# Patient Record
Sex: Female | Born: 1985 | Race: Black or African American | Hispanic: No | Marital: Single | Smoking: Never smoker
Health system: Southern US, Community
[De-identification: ages and names within clinical notes are randomized; demographics above are authoritative.]

## PROBLEM LIST (undated history)

## (undated) DIAGNOSIS — B3324 Viral cardiomyopathy: Secondary | ICD-10-CM

## (undated) DIAGNOSIS — R51 Headache: Secondary | ICD-10-CM

## (undated) DIAGNOSIS — F329 Major depressive disorder, single episode, unspecified: Secondary | ICD-10-CM

## (undated) DIAGNOSIS — F32A Depression, unspecified: Secondary | ICD-10-CM

## (undated) DIAGNOSIS — N3010 Interstitial cystitis (chronic) without hematuria: Secondary | ICD-10-CM

## (undated) DIAGNOSIS — R519 Headache, unspecified: Secondary | ICD-10-CM

## (undated) DIAGNOSIS — D6800 Von Willebrand disease, unspecified: Secondary | ICD-10-CM

## (undated) DIAGNOSIS — R55 Syncope and collapse: Secondary | ICD-10-CM

## (undated) HISTORY — DX: Major depressive disorder, single episode, unspecified: F32.9

## (undated) HISTORY — DX: Headache: R51

## (undated) HISTORY — PX: OTHER SURGICAL HISTORY: SHX169

## (undated) HISTORY — DX: Syncope and collapse: R55

## (undated) HISTORY — DX: Interstitial cystitis (chronic) without hematuria: N30.10

## (undated) HISTORY — DX: Viral cardiomyopathy: B33.24

## (undated) HISTORY — DX: Headache, unspecified: R51.9

## (undated) HISTORY — DX: Depression, unspecified: F32.A

---
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
## 2007-02-17 ENCOUNTER — Ambulatory Visit: Payer: Self-pay | Admitting: Internal Medicine

## 2007-03-09 ENCOUNTER — Ambulatory Visit: Payer: Self-pay | Admitting: Obstetrics & Gynecology

## 2007-03-14 ENCOUNTER — Ambulatory Visit: Payer: Self-pay | Admitting: Internal Medicine

## 2007-03-16 IMAGING — CR RIGHT HAND - COMPLETE 3+ VIEW
1 series · 3 of 3 positions shown · non-contrast
Comparison: none

REASON FOR EXAM: pain
COMMENTS:

PROCEDURE:     DXR - DXR HAND RT COMPLETE W/OBLIQUES  - December 11, 2005  [DATE]
RESULT:     There does not appear to be evidence of fracture, dislocation,
or malalignment. No evidence of soft tissue swelling is appreciated.

[Series 1: view not recorded · 0.17mm/px · 3 of 3 slices shown]
[im 1/3]
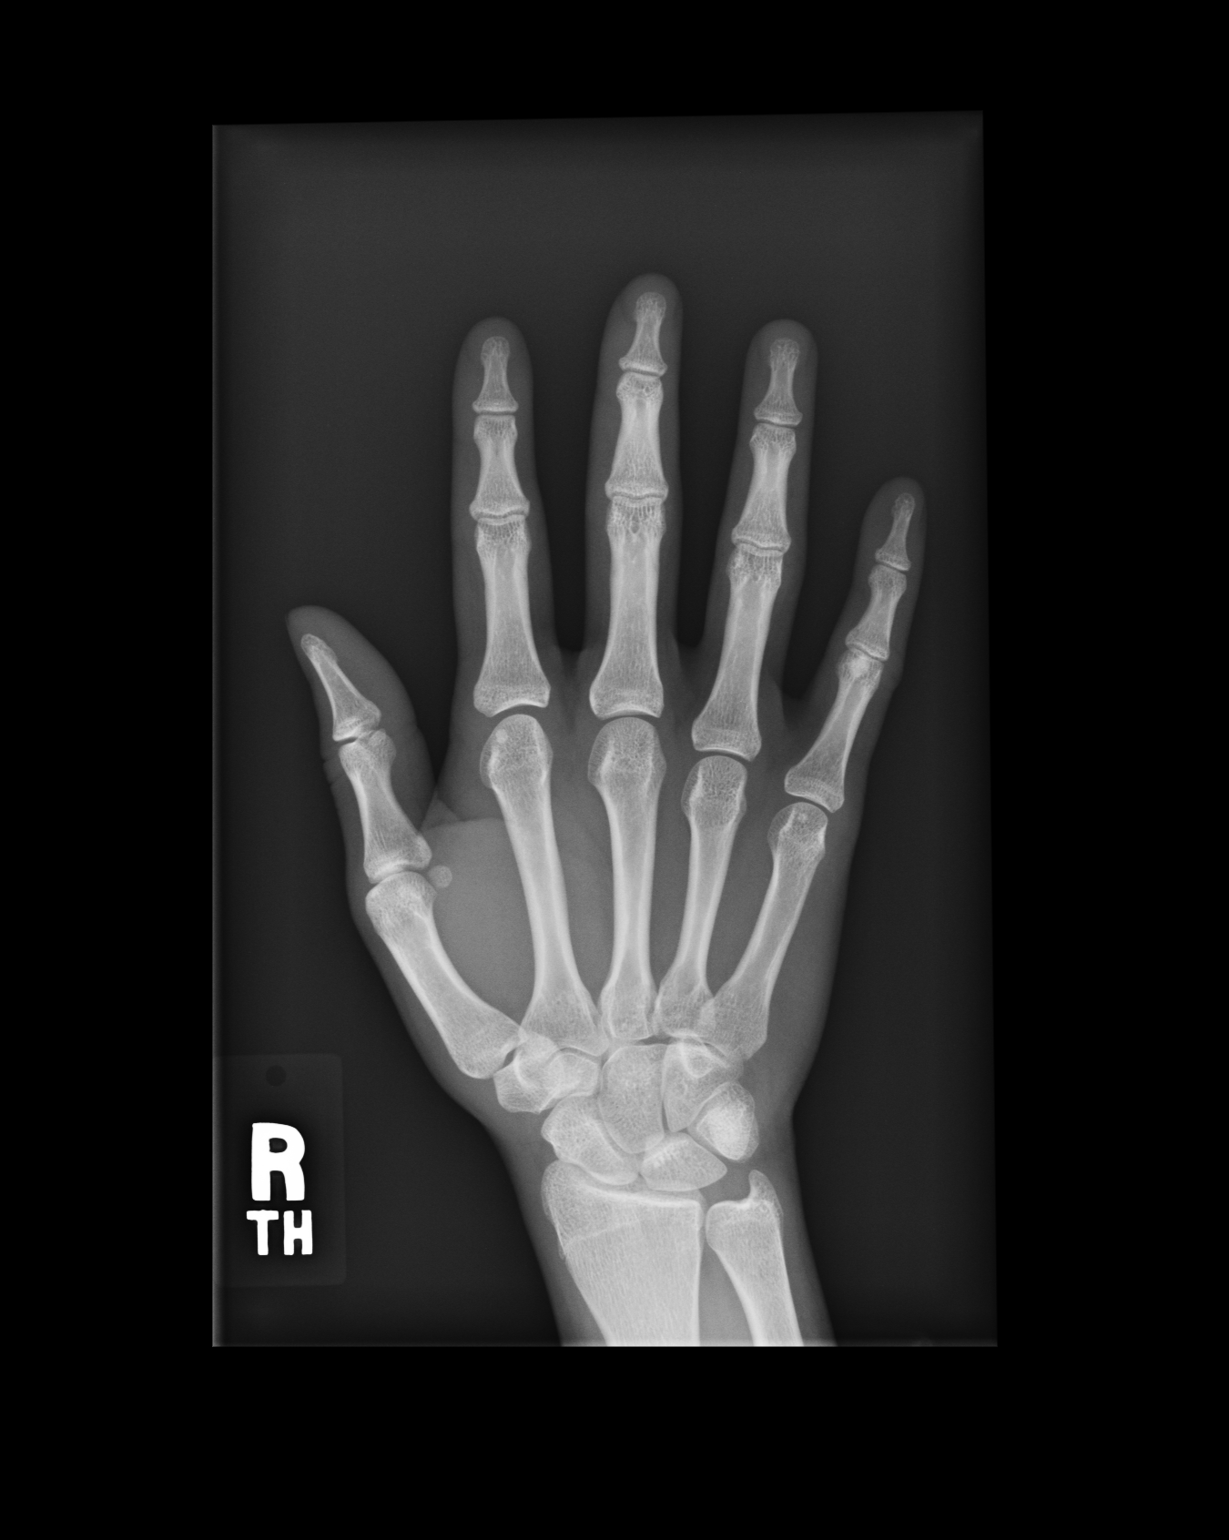
[im 2/3]
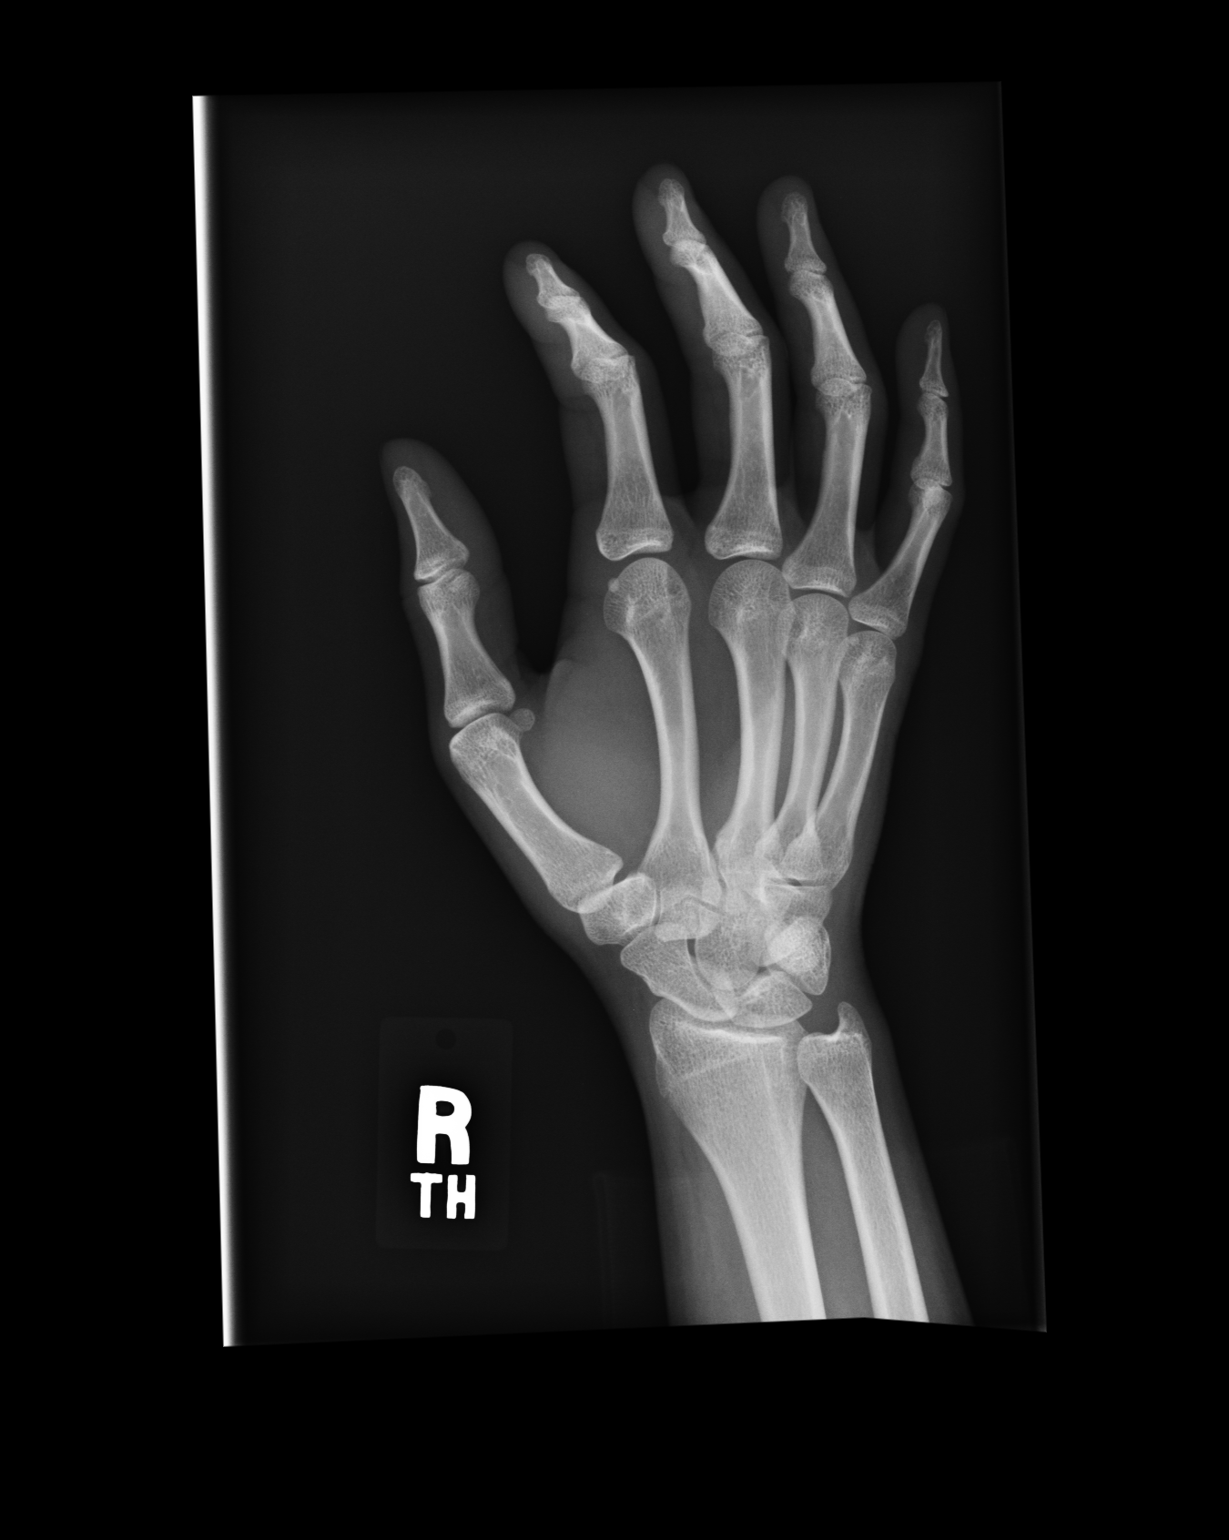
[im 3/3]
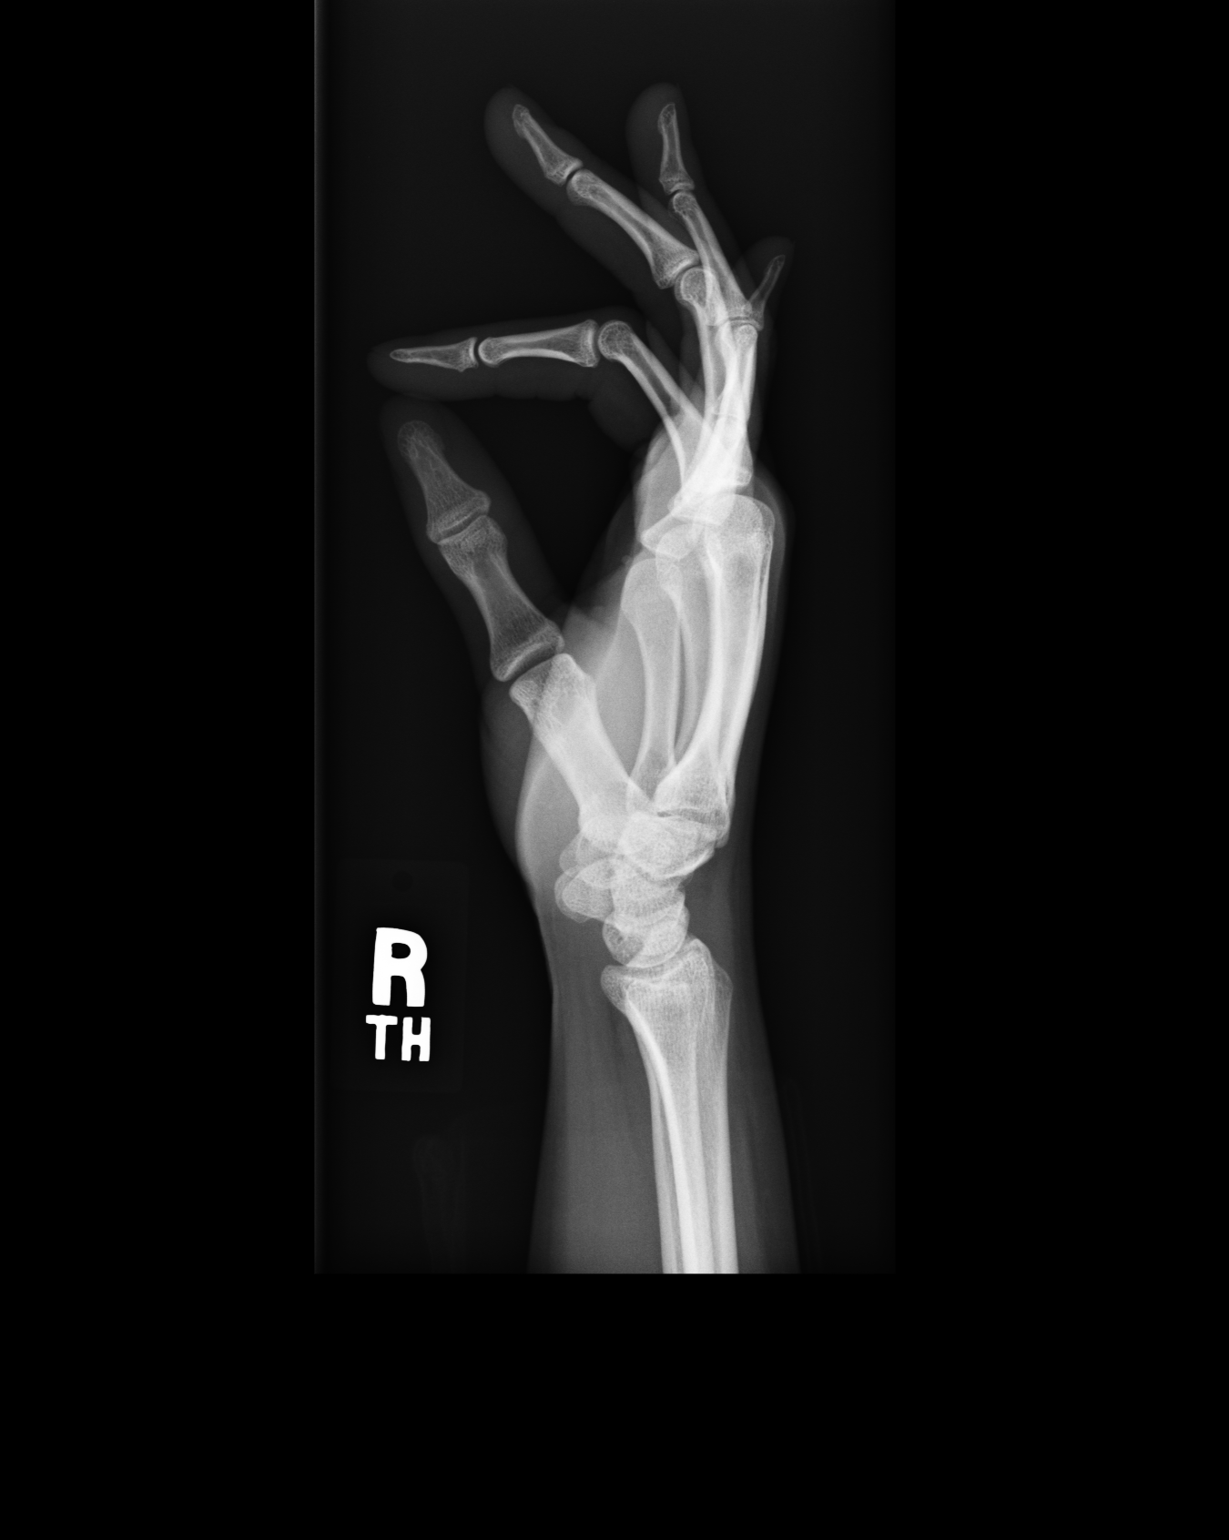

[3 of 3 positions shown; findings below may reference images not displayed]

IMPRESSION: 1)No evidence of acute abnormality. If there are persistent complaints of
pain or persistent clinical concern, repeat evaluation in 7-10 days is
recommended if clinically warranted.

## 2007-03-20 ENCOUNTER — Ambulatory Visit: Payer: Self-pay | Admitting: Oncology

## 2007-03-20 ENCOUNTER — Ambulatory Visit: Payer: Self-pay | Admitting: Internal Medicine

## 2007-04-06 ENCOUNTER — Ambulatory Visit: Payer: Self-pay | Admitting: Obstetrics & Gynecology

## 2007-04-14 ENCOUNTER — Inpatient Hospital Stay: Payer: Self-pay | Admitting: Obstetrics & Gynecology

## 2007-04-19 ENCOUNTER — Ambulatory Visit: Payer: Self-pay | Admitting: Internal Medicine

## 2007-04-19 ENCOUNTER — Ambulatory Visit: Payer: Self-pay | Admitting: Oncology

## 2007-05-20 ENCOUNTER — Ambulatory Visit: Payer: Self-pay | Admitting: Internal Medicine

## 2007-06-07 ENCOUNTER — Ambulatory Visit: Payer: Self-pay | Admitting: Urology

## 2007-07-19 ENCOUNTER — Ambulatory Visit: Payer: Self-pay | Admitting: Urology

## 2011-10-20 DIAGNOSIS — B3324 Viral cardiomyopathy: Secondary | ICD-10-CM

## 2011-10-20 HISTORY — DX: Viral cardiomyopathy: B33.24

## 2012-09-17 ENCOUNTER — Observation Stay: Payer: Self-pay | Admitting: Internal Medicine

## 2012-09-17 LAB — CK TOTAL AND CKMB (NOT AT ARMC): CK-MB: <0.5 ng/mL — ABNORMAL LOW (ref 0.5–3.6)

## 2012-09-17 LAB — COMPREHENSIVE METABOLIC PANEL
Albumin: 4 g/dL (ref 3.4–5.0)
Alkaline Phosphatase: 82 U/L (ref 50–136)
Bilirubin,Total: 0.8 mg/dL (ref 0.2–1.0)
Creatinine: 0.83 mg/dL (ref 0.60–1.30)
Glucose: 91 mg/dL (ref 65–99)
Osmolality: 277 (ref 275–301)
Potassium: 3.7 mmol/L (ref 3.5–5.1)
Sodium: 139 mmol/L (ref 136–145)
Total Protein: 7.8 g/dL (ref 6.4–8.2)

## 2012-09-17 LAB — CBC
HCT: 37.6 % (ref 35.0–47.0)
MCHC: 34.1 g/dL (ref 32.0–36.0)
MCV: 85 fL (ref 80–100)
RDW: 13 % (ref 11.5–14.5)

## 2012-09-17 LAB — URINALYSIS, COMPLETE
Bacteria: NONE SEEN
Bilirubin,UR: NEGATIVE
Blood: NEGATIVE
Glucose,UR: NEGATIVE mg/dL (ref 0–75)
Leukocyte Esterase: NEGATIVE
Ph: 6 (ref 4.5–8.0)
RBC,UR: 1 /HPF (ref 0–5)
Squamous Epithelial: 1
WBC UR: <1 /HPF (ref 0–5)

## 2012-09-19 DIAGNOSIS — R55 Syncope and collapse: Secondary | ICD-10-CM

## 2013-07-04 ENCOUNTER — Ambulatory Visit: Payer: Self-pay

## 2013-07-04 LAB — COMPREHENSIVE METABOLIC PANEL
Alkaline Phosphatase: 80 U/L (ref 50–136)
Bilirubin,Total: 0.8 mg/dL (ref 0.2–1.0)
Chloride: 104 mmol/L (ref 98–107)
Co2: 25 mmol/L (ref 21–32)
Creatinine: 0.82 mg/dL (ref 0.60–1.30)
EGFR (African American): >60
EGFR (Non-African Amer.): >60
Osmolality: 279 (ref 275–301)
Potassium: 3.5 mmol/L (ref 3.5–5.1)
SGPT (ALT): 44 U/L (ref 12–78)
Sodium: 141 mmol/L (ref 136–145)

## 2013-07-04 LAB — CBC WITH DIFFERENTIAL/PLATELET
Basophil #: 0.1 10*3/uL (ref 0.0–0.1)
Basophil %: 0.8 %
Eosinophil %: 0.4 %
Lymphocyte %: 15 %
MCV: 85 fL (ref 80–100)
Monocyte #: 0.7 x10 3/mm (ref 0.2–0.9)
Neutrophil #: 5.2 10*3/uL (ref 1.4–6.5)
Neutrophil %: 74 %
Platelet: 335 10*3/uL (ref 150–440)
RBC: 4.2 10*6/uL (ref 3.80–5.20)

## 2013-07-04 LAB — URINALYSIS, COMPLETE
Bilirubin,UR: NEGATIVE
Ph: 6 (ref 4.5–8.0)

## 2013-07-04 LAB — RAPID INFLUENZA A&B ANTIGENS

## 2013-07-19 ENCOUNTER — Encounter: Payer: Self-pay | Admitting: Adult Health

## 2013-07-19 ENCOUNTER — Ambulatory Visit (INDEPENDENT_AMBULATORY_CARE_PROVIDER_SITE_OTHER): Payer: BC Managed Care – PPO | Admitting: Cardiovascular Disease

## 2013-07-19 ENCOUNTER — Ambulatory Visit (INDEPENDENT_AMBULATORY_CARE_PROVIDER_SITE_OTHER): Payer: BC Managed Care – PPO | Admitting: Adult Health

## 2013-07-19 ENCOUNTER — Encounter: Payer: Self-pay | Admitting: Cardiovascular Disease

## 2013-07-19 VITALS — BP 108/72 | HR 82 | Temp 98.6°F | Resp 12 | Ht 63.0 in | Wt 128.5 lb

## 2013-07-19 VITALS — BP 122/83 | HR 90 | Ht 63.0 in | Wt 128.5 lb

## 2013-07-19 DIAGNOSIS — I428 Other cardiomyopathies: Secondary | ICD-10-CM

## 2013-07-19 DIAGNOSIS — R079 Chest pain, unspecified: Secondary | ICD-10-CM

## 2013-07-19 DIAGNOSIS — B3324 Viral cardiomyopathy: Secondary | ICD-10-CM

## 2013-07-19 DIAGNOSIS — R42 Dizziness and giddiness: Secondary | ICD-10-CM

## 2013-07-19 DIAGNOSIS — R5383 Other fatigue: Secondary | ICD-10-CM

## 2013-07-19 DIAGNOSIS — D680 Von Willebrand's disease: Secondary | ICD-10-CM

## 2013-07-19 DIAGNOSIS — R000 Tachycardia, unspecified: Secondary | ICD-10-CM

## 2013-07-19 DIAGNOSIS — R0789 Other chest pain: Secondary | ICD-10-CM

## 2013-07-19 DIAGNOSIS — Z0000 Encounter for general adult medical examination without abnormal findings: Secondary | ICD-10-CM | POA: Insufficient documentation

## 2013-07-19 DIAGNOSIS — R5381 Other malaise: Secondary | ICD-10-CM

## 2013-07-19 LAB — CBC WITH DIFFERENTIAL/PLATELET
Basophils Relative: 0.6 % (ref 0.0–3.0)
Eosinophils Absolute: 0.1 10*3/uL (ref 0.0–0.7)
Eosinophils Relative: 1.5 % (ref 0.0–5.0)
Hemoglobin: 12.6 g/dL (ref 12.0–15.0)
Lymphocytes Relative: 33.8 % (ref 12.0–46.0)
Lymphs Abs: 1.6 10*3/uL (ref 0.7–4.0)
Monocytes Absolute: 0.3 10*3/uL (ref 0.1–1.0)
Neutrophils Relative %: 58.1 % (ref 43.0–77.0)
Platelets: 474 10*3/uL — ABNORMAL HIGH (ref 150.0–400.0)
RBC: 4.4 Mil/uL (ref 3.87–5.11)

## 2013-07-19 NOTE — Addendum Note (Signed)
Addended by: Rhea Belton R on: 07/19/2013 11:34 AM   Modules accepted: Orders

## 2013-07-19 NOTE — Assessment & Plan Note (Signed)
Patient with von Willebrand's disease diagnosed in 2008. She has not followed up with hematology. She is currently exhibiting metromenorrhagia x10 months. Refer to [facility] hematology.

## 2013-07-19 NOTE — Assessment & Plan Note (Signed)
Charlotte Martin presents today for further evaluation of her shortness of breath with exertion. 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 Her ejection fraction was reportedly around 40%. She was treated with Coreg valsartan with improvement of her left ventricular function. Unfortunately, her blood pressure fell and she was not able to tolerate long-term. She's done fairly well until recently when she started having episodes of lightheadedness as well as recurrent episodes of shortness breath. She states that her main complaint is that of dizziness and fatigue.  Is possible that she has developed recurrent systolic dysfunction. We'll get an echocardiogram for further evaluation of her LV function. I'll see her back in the office in 2-3 months for followup visit.

## 2013-07-19 NOTE — Assessment & Plan Note (Signed)
She'll be seeing a hematologist at [facility].

## 2013-07-19 NOTE — Assessment & Plan Note (Addendum)
Patient with diagnosis of viral cardiomyopathy in 2013 while studying in [place]. She reports being hospitalized for approximately one week. I will try to obtain those medical records. She is now exhibiting many of the symptoms that she was having at that time including excessive sweating, fatigue, fevers, malaise, near-syncopal events. I am referring her to cardiology for further evaluation. The patient had been told that her ejection fraction was 40%. She reports inability to follow up with care at the time secondary to not having insurance. She has an appointment today with Dr. Elease Hashimoto.

## 2013-07-19 NOTE — Assessment & Plan Note (Signed)
She complains of having some dizziness,.  This seems to be one of her largest complaints. I don't think that this has any cardiac etiology. She is not orthostatic. I have recommended that she try eating several   small meals through the day.

## 2013-07-19 NOTE — Patient Instructions (Addendum)
Your physician has requested that you have an echocardiogram.  Echocardiography is a painless test that uses sound waves to create images of your heart.  It provides your doctor with information about the size and shape of your heart and how well your heart's chambers and valves are working.  This procedure takes approximately one hour.  There are no restrictions for this procedure.  Your physician wants you to follow-up in: 2-3 months.

## 2013-07-19 NOTE — Progress Notes (Signed)
Subjective:    Patient ID: Charlotte Martin, female    DOB: 1986/09/27, 27 y.o.   MRN: [id number]  HPI  Charlotte Martin is a pleasant 27 y/o female who presents to clinic to establish care. She has not had a primary care physician in years. Recently living in [place] attending [employer] for her PhD.  She reports that in 2013 she was diagnosed with viral cardiomyopathy and was hospitalized for approximately one week. She reports losing her insurance and unable to follow up with her care. She is currently exhibiting many of the symptoms that she was exhibiting at that time including weakness and fatigue, fevers "off and on", excessive sweating, malaise and syncopal type episodes. She reports that she often feels like she has the flu. The symptoms will subside and then return in 2-3 weeks. She reports that, while she was hospitalized, she had an echo which showed an ejection fraction of 40%.  Patient also report metromenorrhagia. Her menstrual cycles are extremely heavy with a large amount of clots noted. She experiences bad cramping, diarrhea during her cycle. She reports that her cycle is approximately every 2 weeks and this has been ongoing for approximately 10 months. She has a history of von Willebrand but did not follow up with care.     Past Medical History  Diagnosis Date  . Depression   . Von Willebrand's disease   . Frequent headaches   . Viral cardiomyopathy 2013  . Interstitial cystitis   . Syncope and collapse      Past Surgical History  Procedure Laterality Date  . Cystoscopy  2008  . Laparoscopy  2008  . Interstem implant       Family History  Problem Relation Age of Onset  . Arthritis Mother   . Hyperlipidemia Mother   . Hypertension Mother   . Depression Mother   . Diabetes Mother   . Seizures Sister   . Cancer Maternal Aunt     Breast cancer  . Cancer Paternal Aunt     ovarian cancer  . Cancer Maternal Grandmother     breast cancer  .  Diabetes Maternal Grandmother   . Cancer Maternal Grandfather     colon cancer  . Diabetes Maternal Grandfather   . Cancer Paternal Grandmother     ovarian cancer  . Diabetes Paternal Grandmother   . Cancer Paternal Grandfather     prostate cancer  . Diabetes Paternal Grandfather   . Stroke Sister      History   Social History  . Marital Status: Single    Spouse Name: N/A    Number of Children: N/A  . Years of Education: N/A   Occupational History  . Not on file.   Social History Main Topics  . Smoking status: Never Smoker   . Smokeless tobacco: Never Used  . Alcohol Use: No  . Drug Use: No  . Sexual Activity: Not on file   Other Topics Concern  . Not on file   Social History Narrative  . No narrative on file     Review of Systems  Constitutional: Positive for fatigue.  HENT: Negative.   Eyes:       Hx of trouble with peripheral vision. She reports having this examined with nothing identified.  Respiratory: Positive for chest tightness and shortness of breath.        Occasionally has to sit up in bed to breathe better.   Cardiovascular: Negative for chest pain and leg swelling.  Gastrointestinal: Positive for nausea and diarrhea.       Reports nausea and diarrhea associated with menstrual cycle.  Endocrine: Negative.   Genitourinary: Positive for frequency and menstrual problem. Negative for dysuria and hematuria.       Hx of interstitial cystitis. Followed by Dr. Achilles Dunk  Allergic/Immunologic: Negative.   Neurological: Positive for syncope, weakness and light-headedness.  Hematological:       Hx of von Willebrand   Psychiatric/Behavioral: Positive for sleep disturbance.       Hx of situational depression. No symptoms at this time.       Objective:   Physical Exam  Constitutional: She is oriented to person, place, and time. She appears well-developed and well-nourished.  HENT:  Head: Normocephalic and atraumatic.  Right Ear: External ear normal.  Left  Ear: External ear normal.  Nose: Nose normal.  Mouth/Throat: Oropharynx is clear and moist.  Eyes: Conjunctivae and EOM are normal. Pupils are equal, round, and reactive to light.  Neck: Normal range of motion. Neck supple. No tracheal deviation present. No thyromegaly present.  Cardiovascular: Normal rate, regular rhythm, normal heart sounds and intact distal pulses.  Exam reveals no gallop and no friction rub.   No murmur heard. Pulmonary/Chest: Effort normal and breath sounds normal. No respiratory distress. She has no wheezes. She has no rales.  Abdominal: Soft. Bowel sounds are normal. She exhibits no distension and no mass. There is no tenderness. There is no rebound and no guarding.  Musculoskeletal: Normal range of motion. She exhibits no edema and no tenderness.  Lymphadenopathy:    She has no cervical adenopathy.  Neurological: She is alert and oriented to person, place, and time. She has normal reflexes. No cranial nerve deficit. Coordination normal.  Skin: Skin is warm. No rash noted. She is diaphoretic. No erythema.  Psychiatric: She has a normal mood and affect. Her behavior is normal. Judgment and thought content normal.    BP 108/72  Pulse 82  Temp(Src) 98.6 F (37 C) (Oral)  Resp 12  Ht 5\' 3"  (1.6 m)  Wt 128 lb 8 oz (58.287 kg)  BMI 22.77 kg/m2  SpO2 98%  LMP 06/13/2013       Assessment & Plan:

## 2013-07-19 NOTE — Progress Notes (Signed)
Charlotte Martin Date of Birth  [birth year]       [facility]    [facility] [address]  [address] [place], [place]  [address]   [place], [place]  [address] [phone]     [phone]   Fax  [phone]    Fax [phone]  Problem List: 1. ? Viral cardiomyopathy 2. PAF - transient AF in the setting of syncope 3. von Willebrand syndrome - 2008 - she avoids ASA for this reason. 4. Interstitial cycstitis 5.   History of Present Illness:  Charlotte Martin is a 27 yo female with a past hx of viral CM in July 2013.   [place] time - echo is not availale - she recalls being told that her EF was 40%.     She was treated with Coreg and Losartan and improved.  The Coreg and Cozaar were discontinued after her LV function improved and because of episodes of hypotension.  Recently, she has had some recurrence of her dyspnea - DOE.    She has numerous complaints - dizziness,   She used to exercise regularly - not so much this past year - mostly due to busy schedule but also now she just is not able to run like she used to run.    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     Current Outpatient Prescriptions on File Prior to Visit  Medication Sig Dispense Refill  . imipramine (TOFRANIL) 25 MG tablet Take 25 mg by mouth at bedtime.       No current facility-administered medications on file prior to visit.    Allergies  Allergen Reactions  . Aspirin     Past Medical History  Diagnosis Date  . Depression   . Von Willebrand's disease   . Frequent headaches   . Viral cardiomyopathy 2013  . Interstitial cystitis   . Syncope and collapse     Past Surgical History  Procedure Laterality Date  . Cystoscopy  2008  . Laparoscopy  2008  . Interstem implant      History  Smoking status  . Never Smoker   Smokeless tobacco  . Never Used    History  Alcohol Use No    Family History  Problem  Relation Age of Onset  . Arthritis Mother   . Hyperlipidemia Mother   . Hypertension Mother   . Depression Mother   . Diabetes Mother   . Seizures Sister   . Cancer Maternal Aunt     Breast cancer  . Cancer Paternal Aunt     ovarian cancer  . Cancer Maternal Grandmother     breast cancer  . Diabetes Maternal Grandmother   . Cancer Maternal Grandfather     colon cancer  . Diabetes Maternal Grandfather   . Cancer Paternal Grandmother     ovarian cancer  . Diabetes Paternal Grandmother   . Cancer Paternal Grandfather     prostate cancer  . Diabetes Paternal Grandfather   . Stroke Sister     Reviw of Systems:  Reviewed in the HPI.  All other systems are negative.  Physical Exam: Blood pressure 122/83, pulse 90, height 5\' 3"  (1.6 m), weight 128 lb 8 oz (58.287 kg), last menstrual period 06/13/2013. General: Well developed, well nourished, in no acute distress.  Head: Normocephalic, atraumatic, sclera non-icteric, mucus membranes are moist,   Neck: Supple. Carotids are  2 + without bruits. No JVD   Lungs: Clear   Heart: RR, normal S1, S2  Abdomen: Soft, non-tender, non-distended with normal bowel sounds.  Msk:  Strength and tone are normal   Extremities: No clubbing or cyanosis. No edema.  Distal pedal pulses are 2+ and equal    Neuro: CN II - XII intact.  Alert and oriented X 3.   Psych:  Normal   ECG: Oct. 1, 2014:  NSR at 90.    Assessment / Plan:

## 2013-07-19 NOTE — Assessment & Plan Note (Signed)
Patient has not had a regular PCP in many years. Her physical exam shows significant diaphoresis and getting slightly off balance with changing position. Otherwise exam is normal. I will check routine labs including CBC with differential, basic metabolic panel, hepatic panel, TSH, vitamin D, lipids. I am referring her to hematology for further evaluation of von Willebrand's disorder. I am referring her to cardiology for further evaluation of viral cardiomyopathy with patient reports of ejection fraction 40%.

## 2013-07-20 ENCOUNTER — Other Ambulatory Visit: Payer: Self-pay | Admitting: Adult Health

## 2013-07-20 LAB — LIPID PANEL
Total CHOL/HDL Ratio: 3
VLDL: 13.6 mg/dL (ref 0.0–40.0)

## 2013-07-20 LAB — HEPATIC FUNCTION PANEL
ALT: 16 U/L (ref 0–35)
AST: 18 U/L (ref 0–37)
Albumin: 4.3 g/dL (ref 3.5–5.2)
Alkaline Phosphatase: 64 U/L (ref 39–117)
Bilirubin, Direct: 0.1 mg/dL (ref 0.0–0.3)
Total Bilirubin: 0.9 mg/dL (ref 0.3–1.2)
Total Protein: 7.6 g/dL (ref 6.0–8.3)

## 2013-07-20 LAB — BASIC METABOLIC PANEL WITH GFR
BUN: 14 mg/dL (ref 6–23)
CO2: 26 meq/L (ref 19–32)
Calcium: 9.8 mg/dL (ref 8.4–10.5)
Chloride: 105 meq/L (ref 96–112)
Creatinine, Ser: 0.7 mg/dL (ref 0.4–1.2)
GFR: 137.84 mL/min
Glucose, Bld: 107 mg/dL — ABNORMAL HIGH (ref 70–99)
Potassium: 4 meq/L (ref 3.5–5.1)
Sodium: 139 meq/L (ref 135–145)

## 2013-07-20 LAB — VITAMIN D 25 HYDROXY (VIT D DEFICIENCY, FRACTURES): Vit D, 25-Hydroxy: 12 ng/mL — ABNORMAL LOW (ref 30–89)

## 2013-07-20 MED ORDER — ERGOCALCIFEROL 1.25 MG (50000 UT) PO CAPS: 50000.0000 [IU] | ORAL_CAPSULE | ORAL | Status: AC

## 2013-08-02 ENCOUNTER — Encounter: Payer: Self-pay | Admitting: Cardiovascular Disease

## 2013-08-11 ENCOUNTER — Other Ambulatory Visit: Payer: Self-pay

## 2013-08-11 ENCOUNTER — Other Ambulatory Visit (INDEPENDENT_AMBULATORY_CARE_PROVIDER_SITE_OTHER): Payer: BC Managed Care – PPO

## 2013-08-11 DIAGNOSIS — I429 Cardiomyopathy, unspecified: Secondary | ICD-10-CM

## 2013-08-11 DIAGNOSIS — B3324 Viral cardiomyopathy: Secondary | ICD-10-CM

## 2013-08-11 DIAGNOSIS — R0602 Shortness of breath: Secondary | ICD-10-CM

## 2013-08-11 DIAGNOSIS — R079 Chest pain, unspecified: Secondary | ICD-10-CM

## 2013-08-21 ENCOUNTER — Encounter: Payer: Self-pay | Admitting: *Deleted

## 2013-08-24 ENCOUNTER — Other Ambulatory Visit: Payer: Self-pay

## 2013-09-20 ENCOUNTER — Ambulatory Visit: Payer: BC Managed Care – PPO | Admitting: Cardiovascular Disease

## 2013-10-22 ENCOUNTER — Encounter: Payer: Self-pay | Admitting: Adult Health

## 2013-12-21 IMAGING — CR DG CHEST 2V
1 series · 2 of 2 positions shown · non-contrast
Comparison: none

REASON FOR EXAM: syncope
COMMENTS:

[Series 1: w chest pa · 0.14mm/px · 2 of 2 slices shown]
[im 1/2]
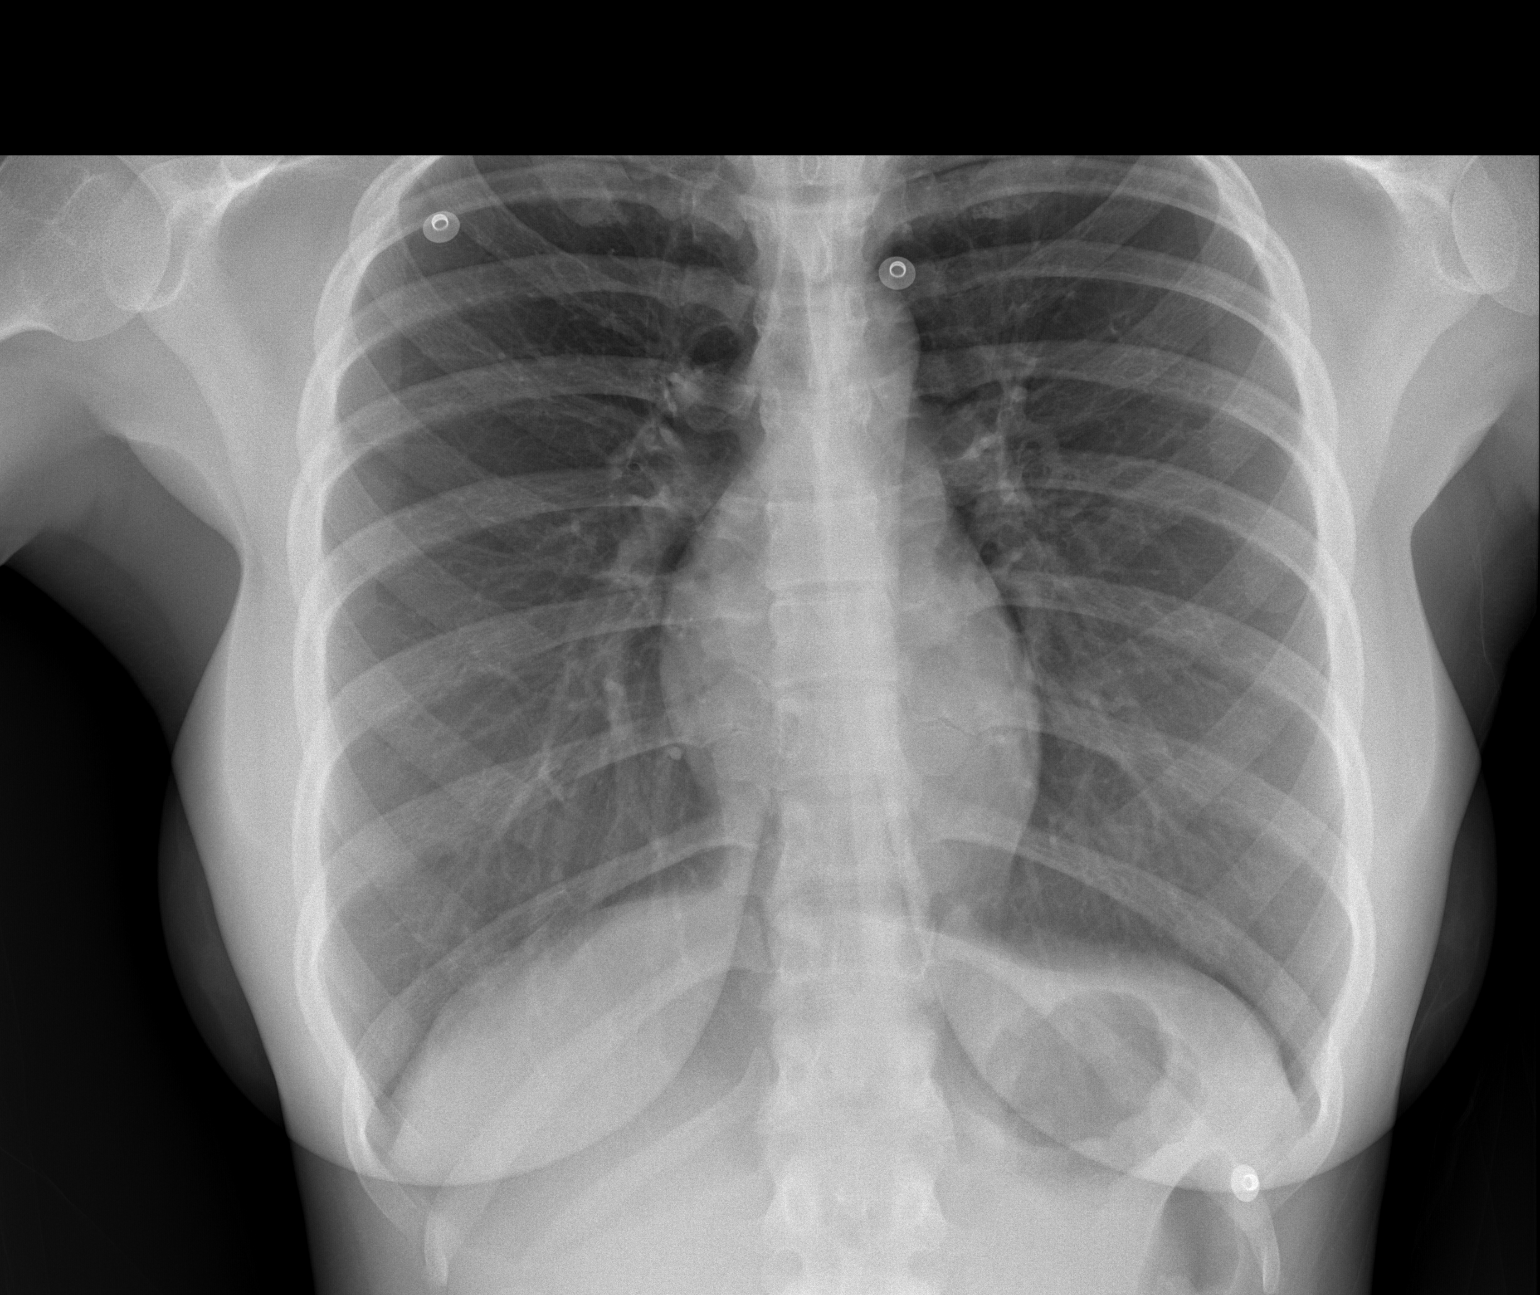
[im 2/2]
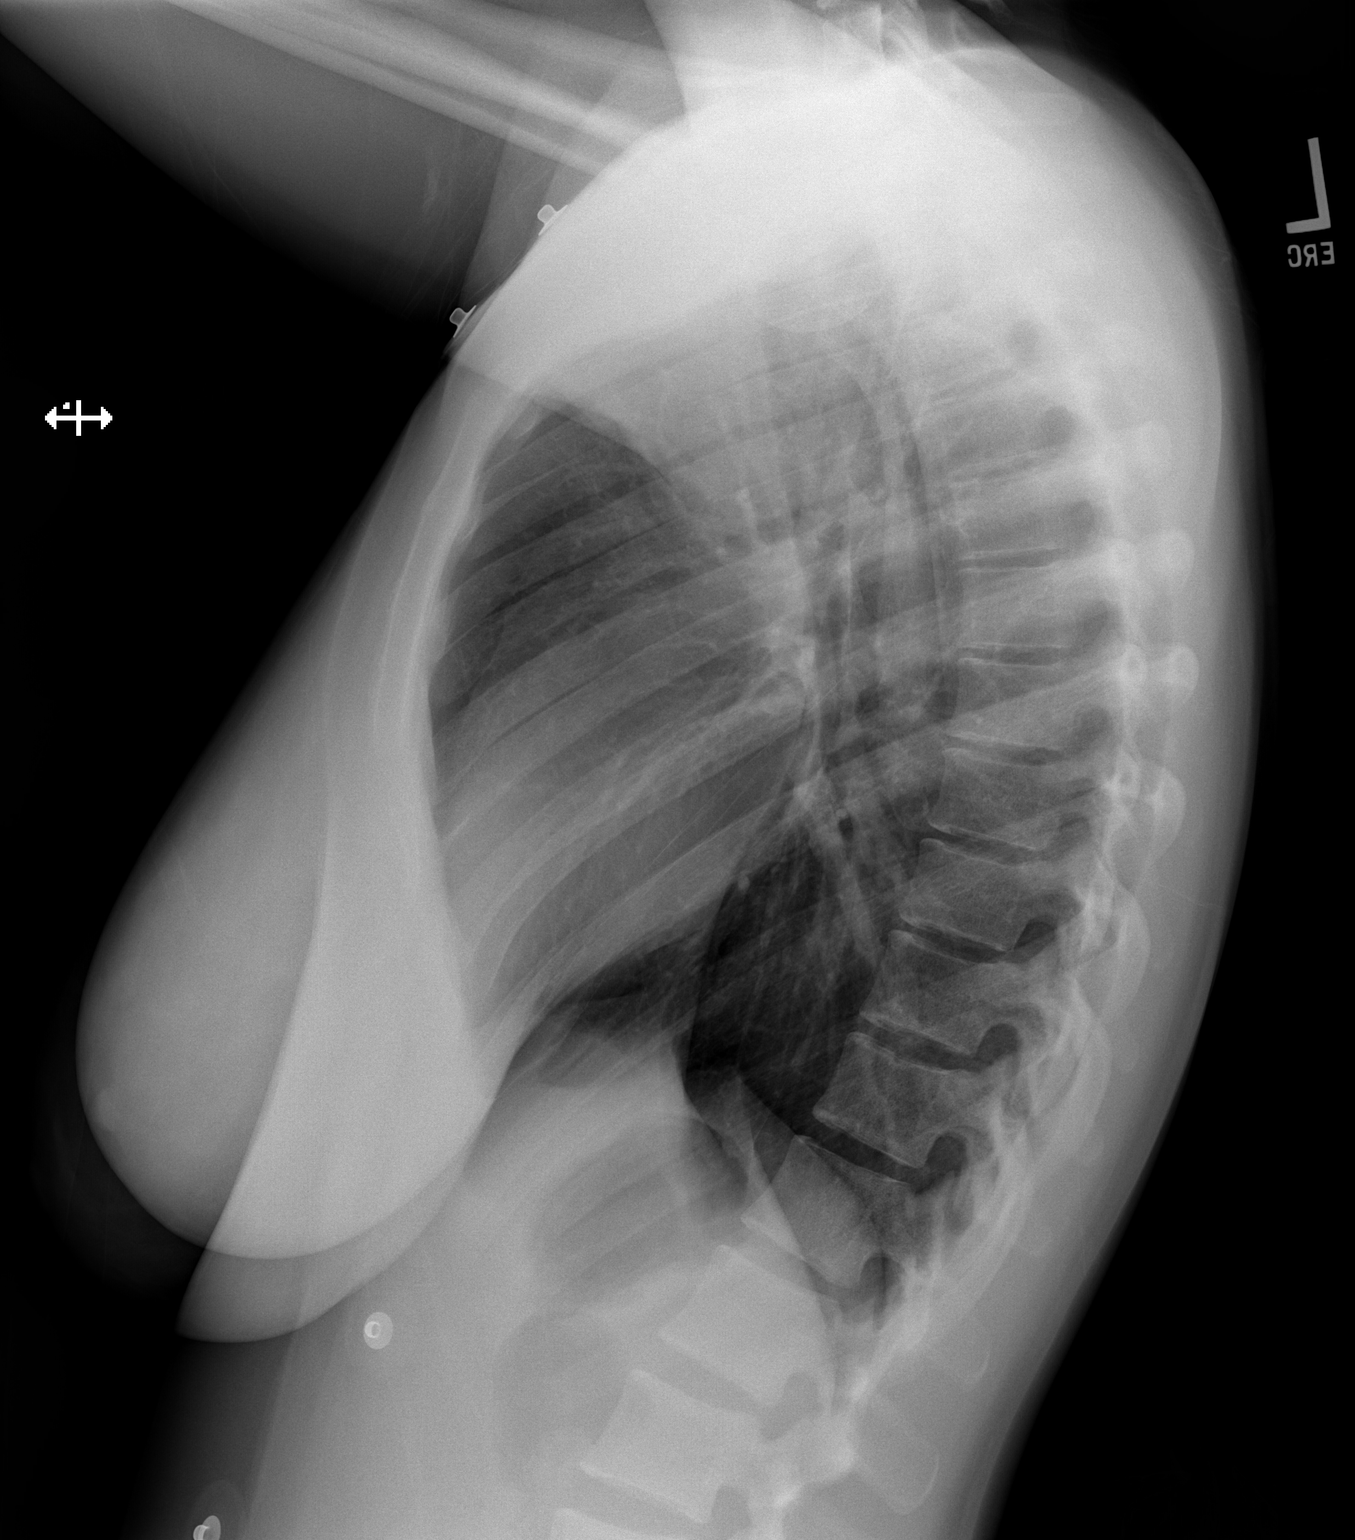

[2 of 2 positions shown; findings below may reference images not displayed]

PROCEDURE:     DXR - DXR CHEST PA (OR AP) AND LATERAL  - September 17, 2012  [DATE]

RESULT:     The lungs are mildly hyperinflated and clear. The cardiac
silhouette is normal in size. The pulmonary vascularity is not engorged.
There is no pleural effusion. The mediastinum is normal in width. The bony
thorax exhibits no acute abnormality.
IMPRESSION: There is hyperinflation consistent with COPD or reactive
airway disease. There is no evidence of pneumonia nor of a pneumothorax.

[REDACTED]

## 2014-08-09 ENCOUNTER — Ambulatory Visit: Payer: Self-pay | Admitting: Internal Medicine

## 2014-11-25 ENCOUNTER — Ambulatory Visit: Payer: Self-pay | Admitting: Internal Medicine

## 2014-11-25 LAB — PREGNANCY, URINE: Pregnancy Test, Urine: NEGATIVE m[IU]/mL

## 2014-11-25 LAB — URINALYSIS, COMPLETE
BACTERIA: NEGATIVE
BILIRUBIN, UR: NEGATIVE
GLUCOSE, UR: NEGATIVE
KETONE: NEGATIVE
Leukocyte Esterase: NEGATIVE
Nitrite: NEGATIVE
PH: 7 (ref 5.0–8.0)
Protein: NEGATIVE
Specific Gravity: 1.01 (ref 1.000–1.030)
WBC UR: NONE SEEN /HPF (ref 0–5)

## 2015-02-05 NOTE — H&P (Signed)
PATIENT NAME:  Charlotte Martin, Charlotte Martin MR#:  [id number] DATE OF BIRTH:  1986-01-11  DATE OF ADMISSION:  09/17/2012  PRIMARY CARE PHYSICIAN: Does not have one.   CHIEF COMPLAINT: Syncope.   HISTORY OF PRESENT ILLNESS: This is a 29 year old female who presents to the Emergency Room after a syncopal episode earlier today. The patient said that she felt fine this morning when she woke up and did not have any symptoms but on the early part of the morning she became a little lightheaded and dizzy coming out of the bathroom and had a syncopal episode. Nobody witnessed the actual syncope but her family members heard a loud thump and came to see her. She was arousable but she was drowsy. The patient remembers having a syncopal event, did not have any trauma to the head, did not have any incontinence, and did not have any seizure-type activity. The patient apparently had a very similar episode to this in June of this past year. She currently does not live here. She is visiting for the holidays. She is a IT consultant at [employer]. She was taken to the hospital when she had a syncopal episode in June of this past year and diagnosed to have a viral cardiomyopathy with EF of 40%. She was put on Coreg and irbesartan. She took it for a few months and was supposed to follow-up with a repeat echo and Holter monitor and a stress echo but never followed up due to insurance reasons. She now came to visit her family and had a syncopal episode here and was brought to the ER. Presently she is hemodynamically stable with no acute complaints. Hospitalist service was contacted for further treatment and evaluation.   REVIEW OF SYSTEMS: CONSTITUTIONAL: No documented fever. No weight gain, no weight loss. EYES: No blurry or double vision. ENT: No tinnitus, no postnasal drip, no redness of the oropharynx. RESPIRATORY: No cough, no wheeze, no hemoptysis, no dyspnea. CARDIOVASCULAR: No chest pain, no orthopnea, no  palpitations. Positive syncope. GI: No nausea, no vomiting, no diarrhea, no abdominal pain, no melena, no hematochezia. GU: No dysuria, no hematuria. ENDOCRINE: No polyuria or nocturia. No heat or cold intolerance. HEME: No anemia, no bruising, no bleeding. INTEGUMENTARY: No rashes, no lesions. MUSCULOSKELETAL: No arthritis, no swelling, no gout. NEUROLOGIC: No numbness, no tingling, no ataxia, no seizure-type activity. PSYCH: No anxiety, no insomnia, no ADD.   PAST MEDICAL HISTORY:  1. Interstitial cystitis and enuresis.  2. 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   SOCIAL HISTORY: No smoking. No alcohol abuse. No illicit drug abuse. Is currently a IT consultant at [employer].   FAMILY HISTORY: No significant family history of coronary artery disease or diabetes.   CURRENT MEDICATIONS: Imipramine 25 mg daily.   PHYSICAL EXAMINATION ON ADMISSION:   VITAL SIGNS: Temperature 99, pulse 98, respirations 18, blood pressure 120/74, sats 99% on room air.   GENERAL: She is a pleasant appearing female in no apparent distress.   HEENT: Atraumatic, normocephalic. Her extraocular muscles are intact. Pupils equal, reactive to light. Sclerae anicteric. No conjunctival injection. No pharyngeal erythema.   NECK: Supple. No jugular venous distention, no bruits, no lymphadenopathy, no thyromegaly.   HEART: Regular rate and rhythm. No murmurs, no rubs, no clicks.   LUNGS: Clear to auscultation bilaterally. No rales, no rhonchi, no wheezes.   ABDOMEN: Soft, flat, nontender, nondistended. Has good bowel sounds. No hepatosplenomegaly appreciated.   EXTREMITIES: There is no evidence of any cyanosis, clubbing, or  peripheral edema. Has +2 pedal and radial pulses bilaterally.   NEUROLOGICAL: The patient is alert, awake, and oriented x3 with no focal motor or sensory deficits appreciated bilaterally.   SKIN: Moist and warm with no rash appreciated.   LYMPHATIC: There is  no cervical or axillary lymphadenopathy.   LABORATORY, DIAGNOSTIC, AND RADIOLOGICAL DATA: Serum glucose 91, BUN 12, creatinine 0.8, sodium 139, potassium 3.7, chloride 105, bicarb 28. LFTs within normal limits. Troponin less than 0.02. White cell count 5.4, hemoglobin 12.8, hematocrit 37.6, platelet count 398. Urinalysis within normal limits.   The patient did have a CT of her chest done with contrast that showed no evidence of CHF or pneumonia. No evidence of any pulmonary embolism.   The patient also had a chest x-ray done which showed no evidence of acute cardiopulmonary disease.   The patient had EKG done which showed normal sinus rhythm with normal axis and no evidence of acute ST or T wave changes.   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  1. Syncope. The exact etiology of this is still unclear. The patient has no evidence of orthostatic hypotension. EKG does not show any evidence of arrhythmia. She does not have any neurological findings suggestive of neurogenic syncope although she had a similar episode like this in June of 2013 and was diagnosed with a viral cardiomyopathy with EF of 40%. I will observe her overnight on telemetry, watch for any arrhythmias. Unlikely this is ischemic, therefore, I will not cycle her cardiac markers. I will get a two-dimensional echocardiogram in the morning. She was on Coreg and losartan as mentioned but had stopped taking it since August of this past year. If her repeat echo at this time shows her EF is completely normal, she likely does not need to be on these medications.  2. Enuresis with history of interstitial cystitis. Continue with imipramine.   CODE STATUS: The patient is a FULL CODE.   TIME SPENT: 45 minutes.   ____________________________ Rolly PancakeVivek J. Cherlynn KaiserSainani,  MD vjs:drc D: 09/17/2012 21:18:21 ET T: 09/18/2012 09:52:49 ET JOB#: [id number]  cc: Rolly PancakeVivek J. Cherlynn KaiserSainani, MD, <Dictator> Houston SirenVIVEK J Desirie Minteer MD ELECTRONICALLY SIGNED 09/22/2012 20:31

## 2015-02-05 NOTE — Discharge Summary (Signed)
PATIENT NAME:  Charlotte Martin, Charlotte Martin MR#:  [id number] DATE OF BIRTH:  [birth year]  DATE OF ADMISSION:  09/17/2012 DATE OF DISCHARGE:  09/19/2012  DIAGNOSES:  1. Syncope of unclear etiology.  2. History of enuresis/interstitial cystitis.   DISPOSITION: The patient is being discharged home.   FOLLOW-UP: Follow-up with PCP in 1 to 2 weeks after discharge.   DIET: Regular.   ACTIVITY: As tolerated.   DISCHARGE MEDICATIONS: Imipramine 25 mg daily.   LABORATORY, DIAGNOSTIC, AND RADIOLOGICAL DATA: CT of the chest showed no evidence of any PE, congestive heart failure, or pneumonia. Residual thymic tissue.   Chest x-ray showed no abnormality.  2-Martin echo showed EF of 50 to 55%.   CBC normal. CMP normal. Cardiac enzymes normal.   HOSPITAL COURSE: The patient is a 29 year old female with past medical history of cystitis, enuresis who reportedly had syncope earlier this year. 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 Her EF was found to be 40%. She was supposed to be taking Coreg and irbesartan, however, she stopped taking that about 2 to 3 months ago due to insurance/financial reasons. The day prior to admission, the patient had a syncopal episode. She was taking a nap in the afternoon and got up and went to the bathroom and subsequently passed out. The etiology of the patient's syncope was unclear. After admission the patient remained stable and was ambulating without any difficulty. Orthostatics were checked and the patient was not orthostatic. She was normal sinus rhythm on telemetry. She had normal cardiac enzymes and normal blood work. A repeat echo was done which showed EF of 50 to 55% and no other significant valvular or wall motion abnormality. The patient plans to return to  [place] tomorrow. She says she will be able to arrange outpatient medical follow-up there. She is being discharged home in a stable condition.   TIME SPENT: 45 minutes.   ____________________________ Darrick MeigsSangeeta Tyvon Eggenberger, MD sp:drc Martin: 09/19/2012 14:13:19 ET T: 09/20/2012 11:33:11 ET JOB#: [id number]  cc: Darrick MeigsSangeeta Shizuye Rupert, MD, <Dictator> Darrick MeigsSANGEETA Charmian Forbis MD ELECTRONICALLY SIGNED 09/20/2012 12:12

## 2015-11-22 ENCOUNTER — Ambulatory Visit
Admission: EM | Admit: 2015-11-22 | Discharge: 2015-11-22 | Disposition: A | Discharge status: Home / Self Care | Payer: BLUE CROSS/BLUE SHIELD | Attending: Family Medicine | Admitting: Family Medicine

## 2015-11-22 ENCOUNTER — Ambulatory Visit (INDEPENDENT_AMBULATORY_CARE_PROVIDER_SITE_OTHER): Payer: BLUE CROSS/BLUE SHIELD

## 2015-11-22 DIAGNOSIS — R109 Unspecified abdominal pain: Secondary | ICD-10-CM

## 2015-11-22 DIAGNOSIS — R10A0 Flank pain, unspecified side: Secondary | ICD-10-CM

## 2015-11-22 DIAGNOSIS — R509 Fever, unspecified: Secondary | ICD-10-CM

## 2015-11-22 LAB — CBC WITH DIFFERENTIAL/PLATELET
BASOS PCT: 1 %
Basophils Absolute: 0.1 10*3/uL (ref 0–0.1)
Eosinophils Absolute: 0 10*3/uL (ref 0–0.7)
Eosinophils Relative: 0 %
HEMATOCRIT: 38.1 % (ref 35.0–47.0)
Hemoglobin: 12.8 g/dL (ref 12.0–16.0)
LYMPHS ABS: 0.8 10*3/uL — AB (ref 1.0–3.6)
Lymphocytes Relative: 16 %
MCH: 28.2 pg (ref 26.0–34.0)
MCHC: 33.7 g/dL (ref 32.0–36.0)
MCV: 83.7 fL (ref 80.0–100.0)
MONO ABS: 0.5 10*3/uL (ref 0.2–0.9)
MONOS PCT: 9 %
NEUTROS ABS: 4 10*3/uL (ref 1.4–6.5)
Neutrophils Relative %: 74 %
Platelets: 293 10*3/uL (ref 150–440)
RBC: 4.55 MIL/uL (ref 3.80–5.20)
RDW: 13.8 % (ref 11.5–14.5)
WBC: 5.4 10*3/uL (ref 3.6–11.0)

## 2015-11-22 LAB — URINALYSIS COMPLETE WITH MICROSCOPIC (ARMC ONLY)
BILIRUBIN URINE: NEGATIVE
Bacteria, UA: NONE SEEN
Glucose, UA: NEGATIVE mg/dL
HGB URINE DIPSTICK: NEGATIVE
LEUKOCYTES UA: NEGATIVE
NITRITE: NEGATIVE
PH: 8.5 — AB (ref 5.0–8.0)
PROTEIN: NEGATIVE mg/dL
RBC / HPF: NONE SEEN RBC/hpf (ref 0–5)
SPECIFIC GRAVITY, URINE: 1.015 (ref 1.005–1.030)

## 2015-11-22 LAB — COMPREHENSIVE METABOLIC PANEL
ALBUMIN: 4.6 g/dL (ref 3.5–5.0)
ALK PHOS: 57 U/L (ref 38–126)
ALT: 14 U/L (ref 14–54)
ANION GAP: 12 (ref 5–15)
AST: 23 U/L (ref 15–41)
BILIRUBIN TOTAL: 1.1 mg/dL (ref 0.3–1.2)
BUN: <5 mg/dL — ABNORMAL LOW (ref 6–20)
CALCIUM: 9 mg/dL (ref 8.9–10.3)
CO2: 23 mmol/L (ref 22–32)
Chloride: 100 mmol/L — ABNORMAL LOW (ref 101–111)
Creatinine, Ser: 0.82 mg/dL (ref 0.44–1.00)
GLUCOSE: 92 mg/dL (ref 65–99)
POTASSIUM: 3.7 mmol/L (ref 3.5–5.1)
Sodium: 135 mmol/L (ref 135–145)
TOTAL PROTEIN: 8 g/dL (ref 6.5–8.1)

## 2015-11-22 MED ORDER — ACETAMINOPHEN 500 MG PO TABS: 1000.0000 mg | ORAL_TABLET | Freq: Once | ORAL | Status: DC

## 2015-11-22 MED ORDER — ACETAMINOPHEN 160 MG/5ML PO SOLN
1000.0000 mg | Freq: Once | ORAL | Status: AC
  Administered 2015-11-22: 1000 mg via ORAL

## 2015-11-22 NOTE — ED Notes (Signed)
Started 2 weeks ago with bilateral flank and abdominal pain. Saw Dr. Achilles Dunk and did U/A and prescribed Xanax for pelvic pain. Saw PMD Tuesday and no tests done-just pain medication and Omeprazole prescribed. No improvement and went to [facility] ER and MSO4 given and Tylenol and had normal blood work and was "going to be admitted but the doctor changed her mind". Today + chills, abdominal pain, bilateral flank pain and can't eat or drink.

## 2015-11-22 NOTE — ED Provider Notes (Signed)
CSN: [id number]     Arrival date & time 11/22/15  0917 History   First MD Initiated Contact with Patient 11/22/15 [phone]     Chief Complaint  Patient presents with  . Fever   (Consider location/radiation/quality/duration/timing/severity/associated sxs/prior Treatment) HPI Comments: 30 yo female with a 2 weeks h/o abdominal/flank pain and 1 week h/o fevers and chills, associated with nausea. Denies any diarrhea, dysuria, cough, chest pains or shortness of breath. States seen at [facility] ED two nights ago and blood/urine tests negative. Patient states she was told there was consideration for admitting her to the hospital. Patient states symptoms of fevers, chills and pain have worsened since then.   Patient is a 30 y.o. female presenting with fever. The history is provided by the patient.  Fever   Past Medical History  Diagnosis Date  . Depression   . Frequent headaches   . Viral cardiomyopathy (HCC) 2013  . Interstitial cystitis   . Syncope and collapse    Past Surgical History  Procedure Laterality Date  . Cystoscopy  2008  . Laparoscopy  2008  . Interstem implant     Family History  Problem Relation Age of Onset  . Arthritis Mother   . Hyperlipidemia Mother   . Hypertension Mother   . Depression Mother   . Diabetes Mother   . Seizures Sister   . Cancer Maternal Aunt     Breast cancer  . Cancer Paternal Aunt     ovarian cancer  . Cancer Maternal Grandmother     breast cancer  . Diabetes Maternal Grandmother   . Cancer Maternal Grandfather     colon cancer  . Diabetes Maternal Grandfather   . Cancer Paternal Grandmother     ovarian cancer  . Diabetes Paternal Grandmother   . Cancer Paternal Grandfather     prostate cancer  . Diabetes Paternal Grandfather   . Stroke Sister    Social History  Substance Use Topics  . Smoking status: Never Smoker   . Smokeless tobacco: Never Used  . Alcohol Use: No   OB History    No data available     Review of Systems   Constitutional: Positive for fever.    Allergies  Aspirin  Home Medications   Prior to Admission medications   Medication Sig Start Date End Date Taking? Authorizing Provider  acetaminophen (TYLENOL) 500 MG tablet Take 500 mg by mouth every 6 (six) hours as needed.   Yes Historical Provider, MD  HYDROcodone-acetaminophen (NORCO) 10-325 MG tablet Take 1 tablet by mouth every 6 (six) hours as needed.   Yes Historical Provider, MD  ondansetron (ZOFRAN) 8 MG tablet Take by mouth every 8 (eight) hours as needed for nausea or vomiting.   Yes Historical Provider, MD  ergocalciferol (DRISDOL) 50000 UNITS capsule Take 1 capsule (50,000 Units total) by mouth once a week. Take once weekly for 12 weeks 07/20/13   Raquel Conni Elliot, NP  imipramine (TOFRANIL) 25 MG tablet Take 25 mg by mouth at bedtime.    Historical Provider, MD   Meds Ordered and Administered this Visit   Medications  acetaminophen (TYLENOL) solution 1,000 mg (1,000 mg Oral Given 11/22/15 1057)    BP 126/72 mmHg  Pulse 128  Temp(Src) 102.1 F (38.9 C) (Tympanic)  Resp 26  Ht  (1.575 m)  Wt 145 lb (65.772 kg)  BMI 26.51 kg/m2  SpO2 100%  LMP 11/07/2015 (Exact Date) No data found.   Physical Exam  Constitutional: She  appears well-developed and well-nourished. No distress.  HENT:  Head: Normocephalic.  Right Ear: Tympanic membrane, external ear and ear canal normal.  Left Ear: Tympanic membrane, external ear and ear canal normal.  Nose: Nose normal.  Mouth/Throat: Oropharynx is clear and moist and mucous membranes are normal.  Eyes: Conjunctivae and EOM are normal. Pupils are equal, round, and reactive to light. Right eye exhibits no discharge. Left eye exhibits no discharge. No scleral icterus.  Neck: Normal range of motion. Neck supple. No JVD present. No tracheal deviation present. No thyromegaly present.  Cardiovascular: Normal rate, regular rhythm, normal heart sounds and intact distal pulses.   No murmur  heard. Pulmonary/Chest: Effort normal and breath sounds normal. No stridor. No respiratory distress. She has no wheezes. She has no rales. She exhibits no tenderness.  Abdominal: Soft. Bowel sounds are normal. She exhibits no distension and no mass. There is no tenderness. There is no rebound and no guarding.  Musculoskeletal: She exhibits no edema or tenderness.  Lymphadenopathy:    She has no cervical adenopathy.  Neurological: She is alert.  Skin: Skin is warm and dry. No rash noted. She is not diaphoretic.  Psychiatric: She has a normal mood and affect.  Vitals reviewed.   ED Course  Procedures (including critical care time)  Labs Review Labs Reviewed  CBC WITH DIFFERENTIAL/PLATELET - Abnormal; Notable for the following:    Lymphs Abs 0.8 (*)    All other components within normal limits  COMPREHENSIVE METABOLIC PANEL - Abnormal; Notable for the following:    Chloride 100 (*)    BUN <5 (*)    All other components within normal limits  URINALYSIS COMPLETEWITH MICROSCOPIC ([facility] ONLY) - Abnormal; Notable for the following:    Ketones, ur 2+ (*)    pH 8.5 (*)    Squamous Epithelial / LPF 0-5 (*)    All other components within normal limits  URINE CULTURE    Imaging Review Dg Chest 2 View  11/22/2015  CLINICAL DATA:  Productive cough and fever for 2 weeks. Initial encounter. EXAM: CHEST  2 VIEW COMPARISON:  CT chest and PA and lateral chest 09/17/2012. FINDINGS: The lungs are clear. Heart size is normal. There is no pneumothorax or pleural effusion. No focal bony abnormality. IMPRESSION: Negative chest. Electronically Signed   By: Drusilla Kanner M.D.   On: 11/22/2015 11:16     Visual Acuity Review  Right Eye Distance:   Left Eye Distance:   Bilateral Distance:    Right Eye Near:   Left Eye Near:    Bilateral Near:         MDM   1. Flank pain   2. Fever, unknown origin    Discussed test results with patient. Recommend patient go to ED for further evaluation and  management. Patient verbalizes understanding, in stable condition and will be transported by private vehicle (mother driving) to [facility] ED per patient for further evaluation. Report called to [facility] ED triage RN.   Payton Mccallum, MD 11/22/15 1150

## 2015-11-24 LAB — URINE CULTURE: SPECIAL REQUESTS: NORMAL

## 2016-02-26 ENCOUNTER — Emergency Department
Admission: EM | Admit: 2016-02-26 | Discharge: 2016-02-26 | Disposition: A | Discharge status: Home / Self Care | Payer: BLUE CROSS/BLUE SHIELD | Attending: Emergency Medicine | Admitting: Emergency Medicine

## 2016-02-26 ENCOUNTER — Emergency Department: Payer: BLUE CROSS/BLUE SHIELD

## 2016-02-26 ENCOUNTER — Encounter: Payer: Self-pay | Admitting: *Deleted

## 2016-02-26 DIAGNOSIS — F329 Major depressive disorder, single episode, unspecified: Secondary | ICD-10-CM | POA: Insufficient documentation

## 2016-02-26 DIAGNOSIS — Z79899 Other long term (current) drug therapy: Secondary | ICD-10-CM | POA: Diagnosis not present

## 2016-02-26 DIAGNOSIS — R55 Syncope and collapse: Secondary | ICD-10-CM | POA: Diagnosis present

## 2016-02-26 DIAGNOSIS — M25511 Pain in right shoulder: Secondary | ICD-10-CM

## 2016-02-26 DIAGNOSIS — B3324 Viral cardiomyopathy: Secondary | ICD-10-CM | POA: Diagnosis not present

## 2016-02-26 DIAGNOSIS — W19XXXA Unspecified fall, initial encounter: Secondary | ICD-10-CM

## 2016-02-26 DIAGNOSIS — R1012 Left upper quadrant pain: Secondary | ICD-10-CM | POA: Diagnosis not present

## 2016-02-26 HISTORY — DX: Von Willebrand's disease: D68.0

## 2016-02-26 HISTORY — DX: Von Willebrand disease, unspecified: D68.00

## 2016-02-26 LAB — URINALYSIS COMPLETE WITH MICROSCOPIC (ARMC ONLY)
BACTERIA UA: NONE SEEN
BILIRUBIN URINE: NEGATIVE
Glucose, UA: NEGATIVE mg/dL
HGB URINE DIPSTICK: NEGATIVE
KETONES UR: NEGATIVE mg/dL
LEUKOCYTES UA: NEGATIVE
NITRITE: NEGATIVE
PH: 8 (ref 5.0–8.0)
PROTEIN: NEGATIVE mg/dL
Specific Gravity, Urine: 1.005 (ref 1.005–1.030)

## 2016-02-26 LAB — HEPATIC FUNCTION PANEL
ALBUMIN: 4.6 g/dL (ref 3.5–5.0)
ALT: 14 U/L (ref 14–54)
AST: 18 U/L (ref 15–41)
Alkaline Phosphatase: 58 U/L (ref 38–126)
BILIRUBIN DIRECT: 0.1 mg/dL (ref 0.1–0.5)
BILIRUBIN TOTAL: 0.7 mg/dL (ref 0.3–1.2)
Indirect Bilirubin: 0.6 mg/dL (ref 0.3–0.9)
Total Protein: 8 g/dL (ref 6.5–8.1)

## 2016-02-26 LAB — BASIC METABOLIC PANEL
ANION GAP: 8 (ref 5–15)
BUN: 12 mg/dL (ref 6–20)
CO2: 26 mmol/L (ref 22–32)
Calcium: 9.3 mg/dL (ref 8.9–10.3)
Chloride: 105 mmol/L (ref 101–111)
Creatinine, Ser: 0.65 mg/dL (ref 0.44–1.00)
Glucose, Bld: 112 mg/dL — ABNORMAL HIGH (ref 65–99)
Potassium: 4.1 mmol/L (ref 3.5–5.1)
Sodium: 139 mmol/L (ref 135–145)

## 2016-02-26 LAB — CBC
HEMATOCRIT: 39.9 % (ref 35.0–47.0)
Hemoglobin: 13.5 g/dL (ref 12.0–16.0)
MCH: 28.8 pg (ref 26.0–34.0)
MCHC: 33.9 g/dL (ref 32.0–36.0)
MCV: 84.9 fL (ref 80.0–100.0)
Platelets: 378 10*3/uL (ref 150–440)
RBC: 4.7 MIL/uL (ref 3.80–5.20)
RDW: 13.5 % (ref 11.5–14.5)
WBC: 4.2 10*3/uL (ref 3.6–11.0)

## 2016-02-26 LAB — POCT PREGNANCY, URINE: PREG TEST UR: NEGATIVE

## 2016-02-26 LAB — LIPASE, BLOOD: LIPASE: 23 U/L (ref 11–51)

## 2016-02-26 MED ORDER — SODIUM CHLORIDE 0.9 % IV BOLUS (SEPSIS)
1000.0000 mL | Freq: Once | INTRAVENOUS | Status: AC
  Administered 2016-02-26: 1000 mL via INTRAVENOUS

## 2016-02-26 NOTE — ED Notes (Signed)
Pt informed to return if any life threatening symptoms occur.  

## 2016-02-26 NOTE — ED Notes (Signed)
States she woke up this AM and felt dizzy, while getting ready she states a syncopal episode, now states right shoulder pain, headache and dizziness, states abd pain of recent and has been unable to eat and loosing a lot of weight, pt awake and alert, states lose of 20 lbs in the past month

## 2016-02-26 NOTE — ED Provider Notes (Signed)
[facility] Emergency Department Provider Note  Time seen: 8:38 AM  I have reviewed the triage vital signs and the nursing notes.   HISTORY  Chief Complaint Loss of Consciousness    HPI Charlotte Martin is a 30 y.o. female with a past medical history of depression, headaches, history of viral cardiomyopathy, history of syncope in the past, presents to the emergency department for syncopal episode this morning. According to the patient she was getting ready this morning, had bent over to get something in the pantry and stood up and had a syncopal episode.Patient believes she hit the right side of her face on the ground. He does state mild headache. States right shoulder pain. As a secondary complaint of patient states for the past 2-3 months she has been having upper and left upper quadrant abdominal discomfort and has lost 10-20 pounds. Patient states her doctors currently working this up, has placed her on ensure, but they've not found a cause for her discomfort. Describes the abdominal pain is mild but constant. Dull aching in sensation. Denies vomiting or diarrhea. Does state mild dysuria. Denies fever. States the abdominal pain is largely unchanged over the past 2 months.     Past Medical History  Diagnosis Date  . Depression   . Frequent headaches   . Viral cardiomyopathy (HCC) 2013  . Interstitial cystitis   . Syncope and collapse   . Von Willebrand disease [facility])     Patient Active Problem List   Diagnosis Date Noted  . Viral cardiomyopathy (HCC) 07/19/2013  . Von Willebrands disease (HCC) 07/19/2013  . Dizziness 07/19/2013  . Routine general medical examination at a health care facility 07/19/2013    Past Surgical History  Procedure Laterality Date  . Cystoscopy  2008  . Laparoscopy  2008  . Interstem implant      Current Outpatient Rx  Name  Route  Sig  Dispense  Refill  . acetaminophen (TYLENOL) 500 MG tablet   Oral   Take 500 mg  by mouth every 6 (six) hours as needed.         . ergocalciferol (DRISDOL) 50000 UNITS capsule   Oral   Take 1 capsule (50,000 Units total) by mouth once a week. Take once weekly for 12 weeks   4 capsule   2   . HYDROcodone-acetaminophen (NORCO) 10-325 MG tablet   Oral   Take 1 tablet by mouth every 6 (six) hours as needed.         Marland Kitchen imipramine (TOFRANIL) 25 MG tablet   Oral   Take 25 mg by mouth at bedtime.         . ondansetron (ZOFRAN) 8 MG tablet   Oral   Take by mouth every 8 (eight) hours as needed for nausea or vomiting.           Allergies Aspirin  Family History  Problem Relation Age of Onset  . Arthritis Mother   . Hyperlipidemia Mother   . Hypertension Mother   . Depression Mother   . Diabetes Mother   . Seizures Sister   . Cancer Maternal Aunt     Breast cancer  . Cancer Paternal Aunt     ovarian cancer  . Cancer Maternal Grandmother     breast cancer  . Diabetes Maternal Grandmother   . Cancer Maternal Grandfather     colon cancer  . Diabetes Maternal Grandfather   . Cancer Paternal Grandmother     ovarian cancer  .  Diabetes Paternal Grandmother   . Cancer Paternal Grandfather     prostate cancer  . Diabetes Paternal Grandfather   . Stroke Sister     Social History Social History  Substance Use Topics  . Smoking status: Never Smoker   . Smokeless tobacco: Never Used  . Alcohol Use: No    Review of Systems Constitutional: Negative for fever.Positive for syncopal event. Cardiovascular: Negative for chest pain. Respiratory: Negative for shortness of breath. Gastrointestinal: Left upper quadrant abdominal pain. Negative for nausea, vomiting, diarrhea Genitourinary: Mild dysuria Musculoskeletal: Negative for back pain. Skin: Negative for rash. Neurological: Negative for headache 10-point ROS otherwise negative.  ____________________________________________   PHYSICAL EXAM:  VITAL SIGNS: ED Triage Vitals  Enc Vitals Group      BP --      Pulse --      Resp --      Temp --      Temp src --      SpO2 --      Weight 02/26/16 0826 136 lb (61.689 kg)     Height 02/26/16 0826  (1.6 m)     Head Cir --      Peak Flow --      Pain Score 02/26/16 0826 6     Pain Loc --      Pain Edu? --      Excl. in GC? --     Constitutional: Alert and oriented. Well appearing and in no distress. Eyes: Normal exam ENT   Head: Normocephalic and atraumatic.   Mouth/Throat: Mucous membranes are moist. Cardiovascular: Normal rate, regular rhythm. No murmur Respiratory: Normal respiratory effort without tachypnea nor retractions. Breath sounds are clear  Gastrointestinal: Soft, mild left upper quadrant tenderness to palpation. No rebound or guarding. No distention. Musculoskeletal: Nontender with normal range of motion in all extremities. Moderate right shoulder tenderness however good range of motion. Neurologic:  Normal speech and language. No gross focal neurologic deficits. Equal grip strengths bilaterally. No cranial nerve deficits. Skin:  Skin is warm, dry and intact.  Psychiatric: Mood and affect are normal.   ____________________________________________    EKG  EKG reviewed and interpreted, such as normal sinus rhythm at 86 bpm, narrow QRS, normal axis, normal intervals, no ST changes. Overall normal EKG.  ____________________________________________    RADIOLOGY  Shoulder x-ray negative  ____________________________________________    INITIAL IMPRESSION / ASSESSMENT AND PLAN / ED COURSE  Pertinent labs & imaging results that were available during my care of the patient were reviewed by me and considered in my medical decision making (see chart for details).  Patient presents to the emergency department after a syncopal episode this morning. Patient states she was bending down to get something and she stood up and felt lightheaded and passed out. She did hit the right side of her face, but is  largely atraumatic appearing. Patient has mild to moderate right shoulder pain. We will obtain an x-ray of the right shoulder, obtain labs, IV hydrate. EKG is reassuring. As the patient has been having ongoing upper and left upper quadrant abdominal discomfort will obtain labs to help further evaluate although the patient states there has been no recent change in this discomfort.  X-ray negative. Labs are largely within normal limits. Patient states she is feeling better after IV fluids. We'll discharge the patient home. I discussed with the patient the need to increase oral fluids at home and follow up with her primary care physician. Patient agreeable plan.  ____________________________________________  FINAL CLINICAL IMPRESSION(S) / ED DIAGNOSES  Abdominal pain Syncope   Minna AntisKevin Taryne Kiger, MD 02/26/16 1121

## 2016-02-26 NOTE — Discharge Instructions (Signed)
Syncope °Syncope means a person passes out (faints). The person usually wakes up in less than 5 minutes. It is important to seek medical care for syncope. °HOME CARE °· Have someone stay with you until you feel normal. °· Do not drive, use machines, or play sports until your doctor says it is okay. °· Keep all doctor visits as told. °· Lie down when you feel like you might pass out. Take deep breaths. Wait until you feel normal before standing up. °· Drink enough fluids to keep your pee (urine) clear or pale yellow. °· If you take blood pressure or heart medicine, get up slowly. Take several minutes to sit and then stand. °GET HELP RIGHT AWAY IF:  °· You have a severe headache. °· You have pain in the chest, belly (abdomen), or back. °· You are bleeding from the mouth or butt (rectum). °· You have black or tarry poop (stool). °· You have an irregular or very fast heartbeat. °· You have pain with breathing. °· You keep passing out, or you have shaking (seizures) when you pass out. °· You pass out when sitting or lying down. °· You feel confused. °· You have trouble walking. °· You have severe weakness. °· You have vision problems. °If you fainted, call for help (911 in [place] Do not drive yourself to the hospital. °  °This information is not intended to replace advice given to you by your health care provider. Make sure you discuss any questions you have with your health care provider. °  °Document Released: 03/23/2008 Document Revised: 02/19/2015 Document Reviewed: 12/04/2011 °Elsevier Interactive Patient Education ©2016 [employer] Inc[employer] ° °

## 2016-02-26 NOTE — ED Notes (Signed)
Pt reports she bent over this am to pick something up and when she raised up she passed out. Pt reports she hit the right side of her face when she fell and injured her rt shoulder. Pt also reports upper abd pain x 2 months with unexplained weight loss of 20lbs in two months. Pt A/O x 4.

## 2016-06-18 ENCOUNTER — Telehealth: Payer: Self-pay | Admitting: Cardiovascular Disease

## 2016-06-18 NOTE — Telephone Encounter (Signed)
Received records request from [employer], forwarded to [employer] for processing  °

## 2016-07-29 ENCOUNTER — Telehealth: Payer: Self-pay | Admitting: Cardiovascular Disease

## 2016-07-29 NOTE — Telephone Encounter (Signed)
Received records request from [employer], forwarded to [employer] for processing  °

## 2016-09-21 NOTE — Telephone Encounter (Signed)
[place], via interoffice, for Dr. Elease HashimotoNahser to fill out

## 2016-09-29 ENCOUNTER — Telehealth: Payer: Self-pay | Admitting: Cardiovascular Disease

## 2016-09-29 NOTE — Telephone Encounter (Signed)
[facility] calling re: [employer] forms sent to [place] office .  Forms not charted as received yesterday.  Patient is seen by Nahser in [place] office .  Relayed fax and direct number for his office for future reference.

## 2016-11-03 ENCOUNTER — Telehealth: Payer: Self-pay | Admitting: Cardiovascular Disease

## 2016-11-03 NOTE — Telephone Encounter (Signed)
Received records request from [employer] , forwarded to [facility] for processing.

## 2017-02-24 IMAGING — CR DG CHEST 2V
2 series · 2 of 2 positions shown · non-contrast
Comparison: CT chest and PA and lateral chest 09/17/2012.

CLINICAL DATA: Productive cough and fever for 2 weeks. Initial
encounter.

EXAM:
CHEST  2 VIEW

[chest pa]
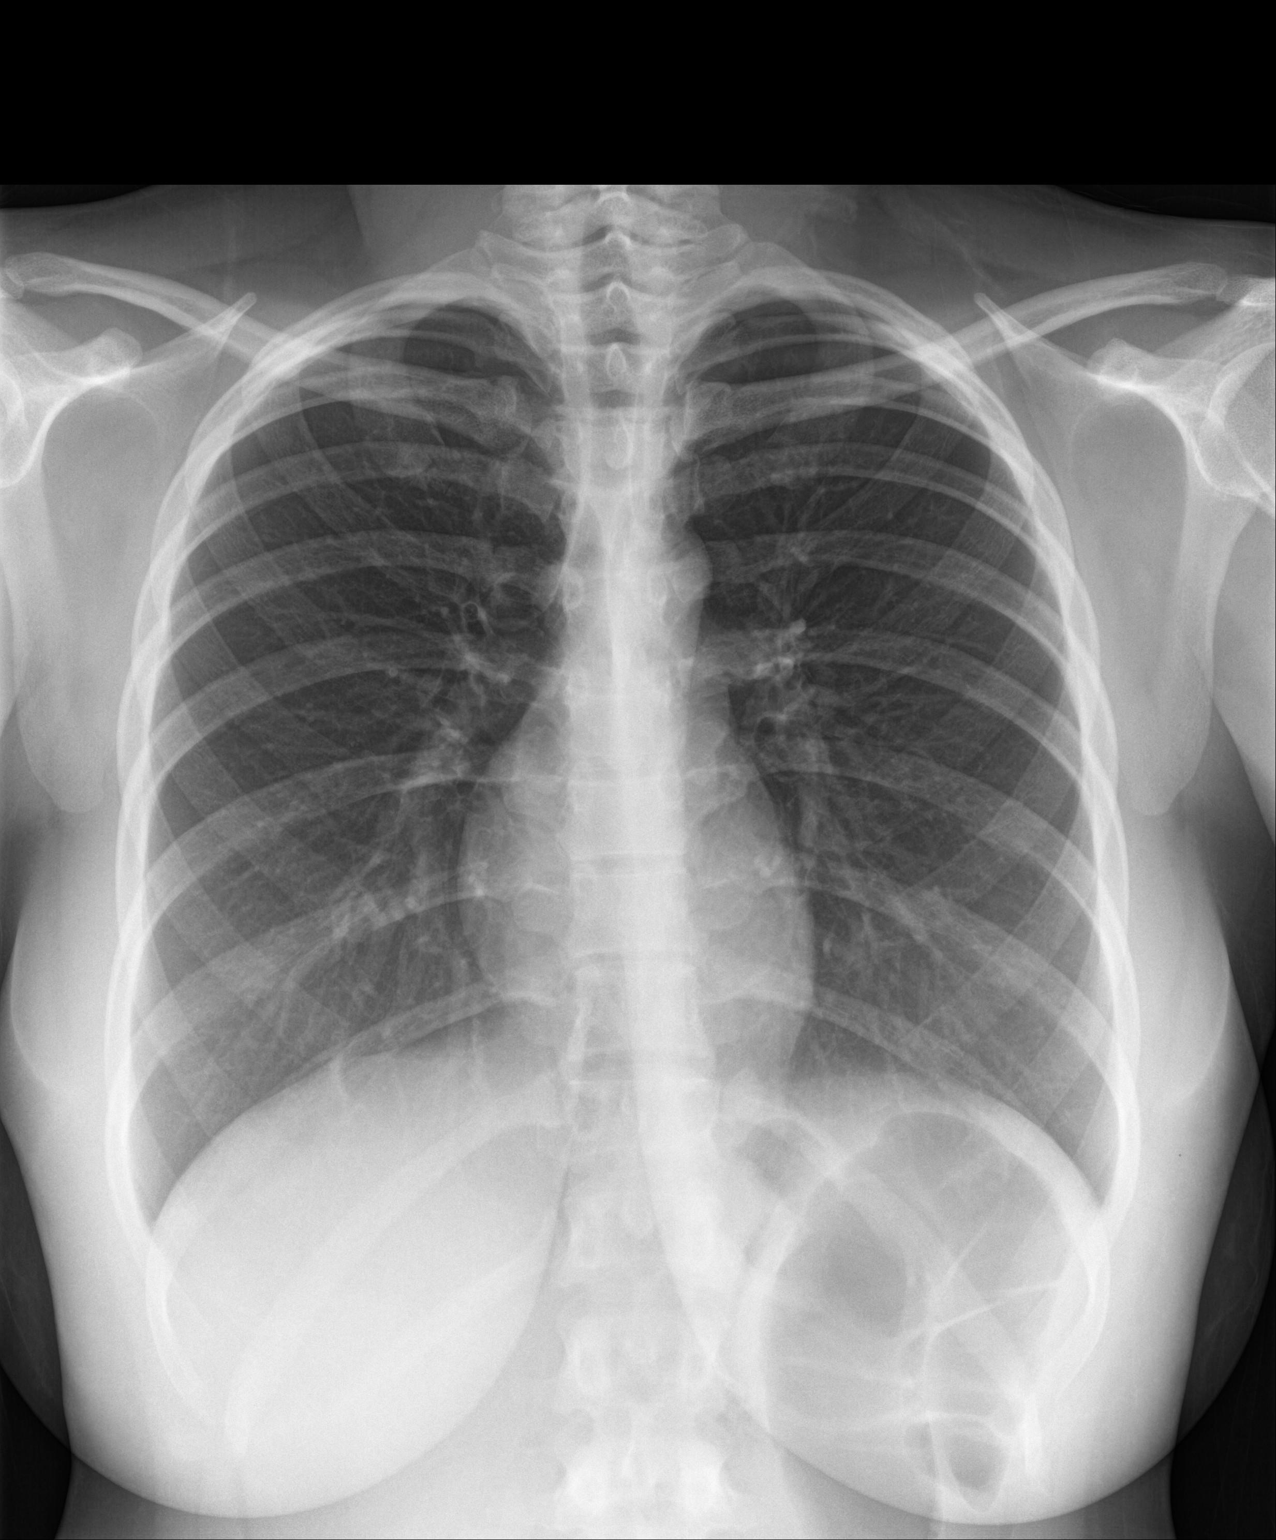

[chest lat]
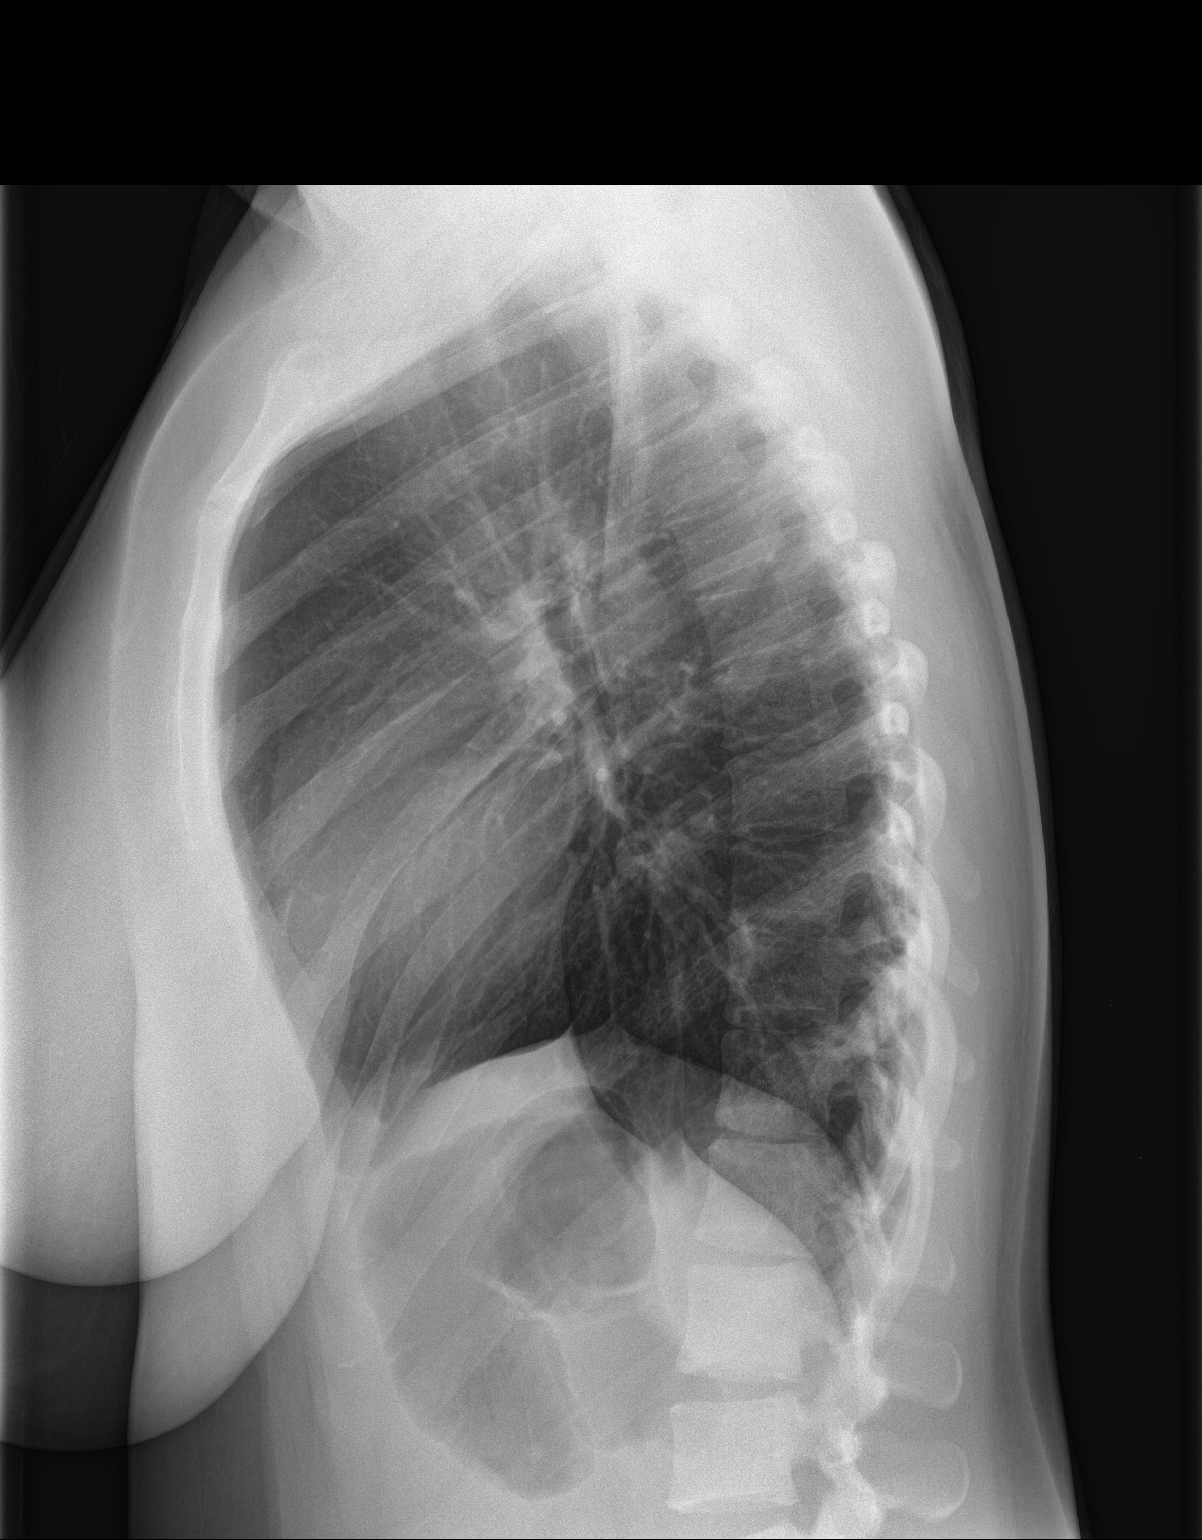

[2 of 2 positions shown; findings below may reference images not displayed]

FINDINGS: The lungs are clear. Heart size is normal. There is no pneumothorax
or pleural effusion. No focal bony abnormality.
IMPRESSION: Negative chest.

## 2017-05-31 IMAGING — CR DG SHOULDER 2+V*R*
1 series · 3 of 3 positions shown · non-contrast
Comparison: None.

CLINICAL DATA: Acute right shoulder pain after fall today. Initial
encounter.

EXAM:
RIGHT SHOULDER - 2+ VIEW

[Series 1: dg shoulder right · 0.14mm/px · 3 of 3 slices shown]
[im 1/3]
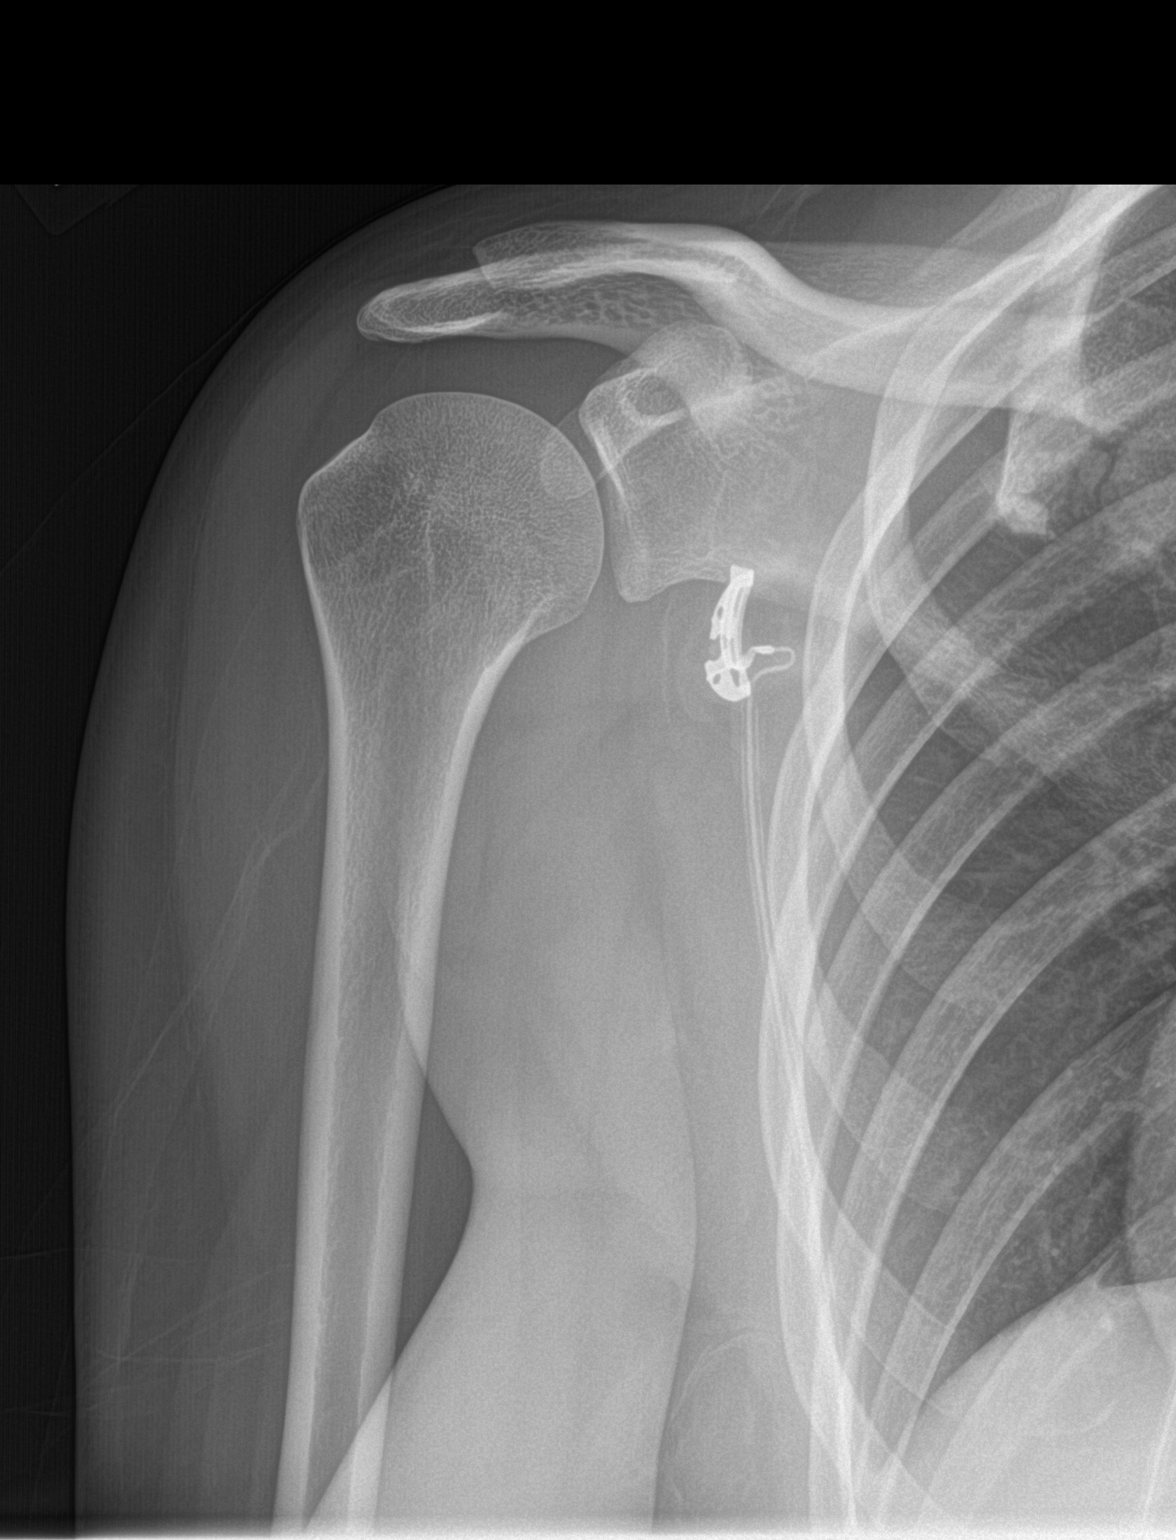
[im 2/3]
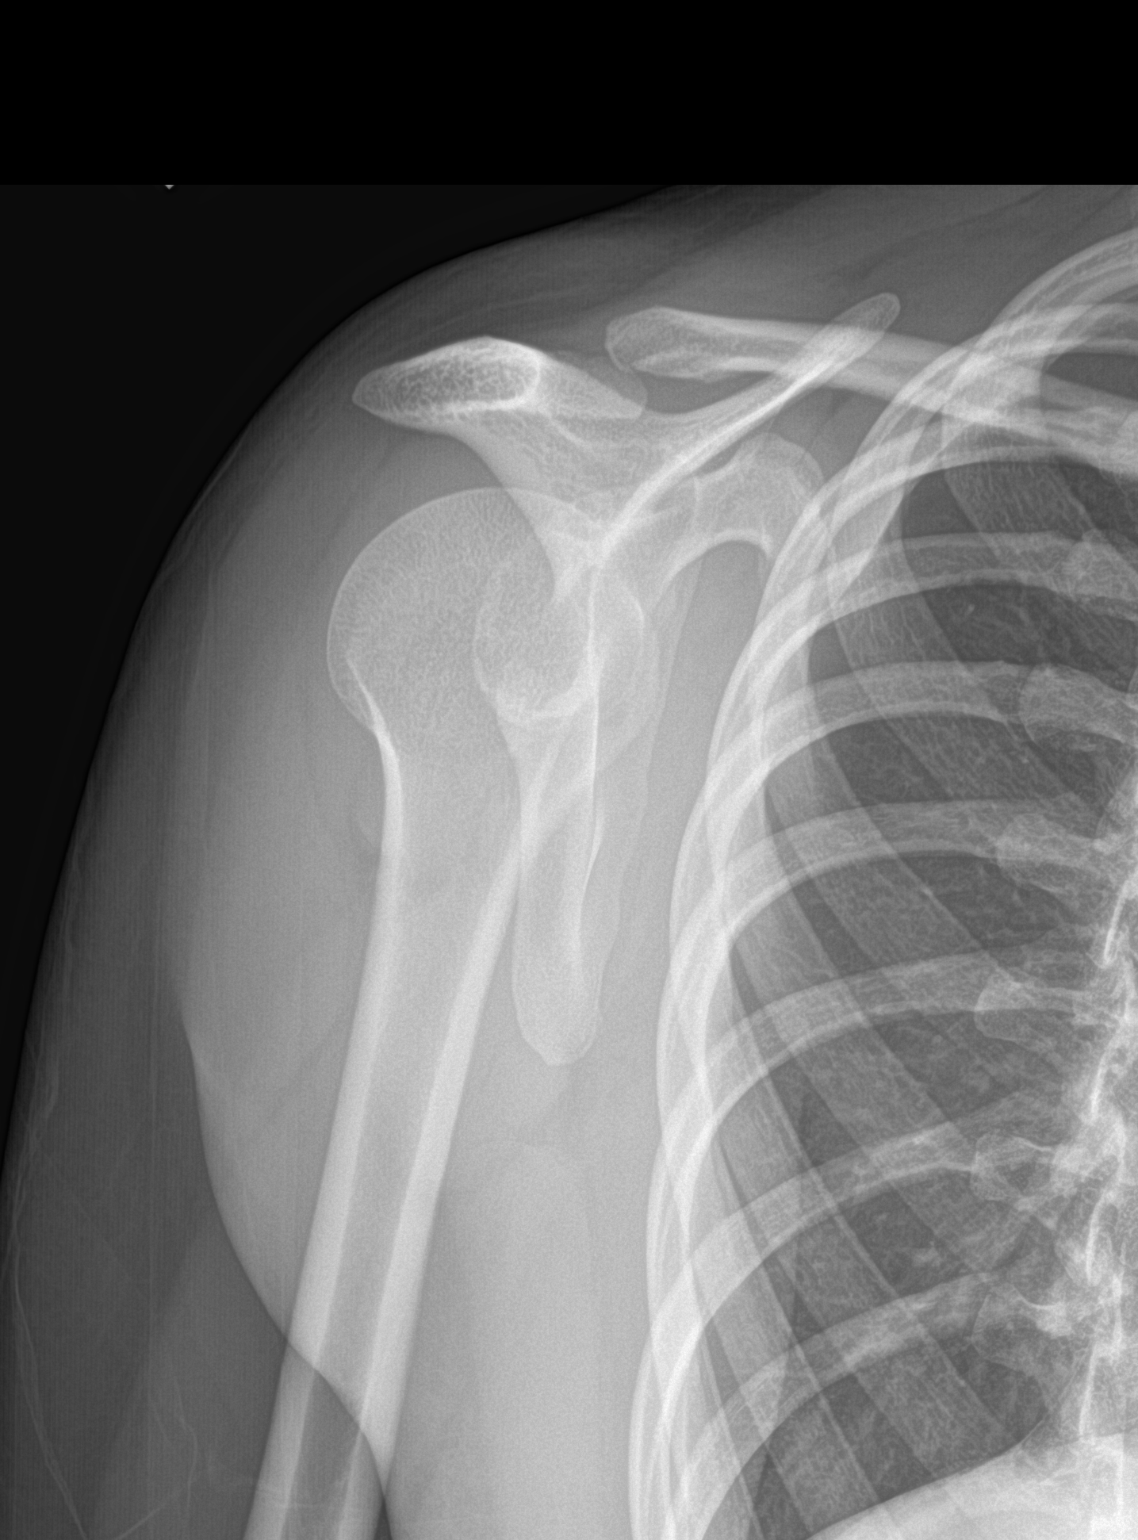
[im 3/3]
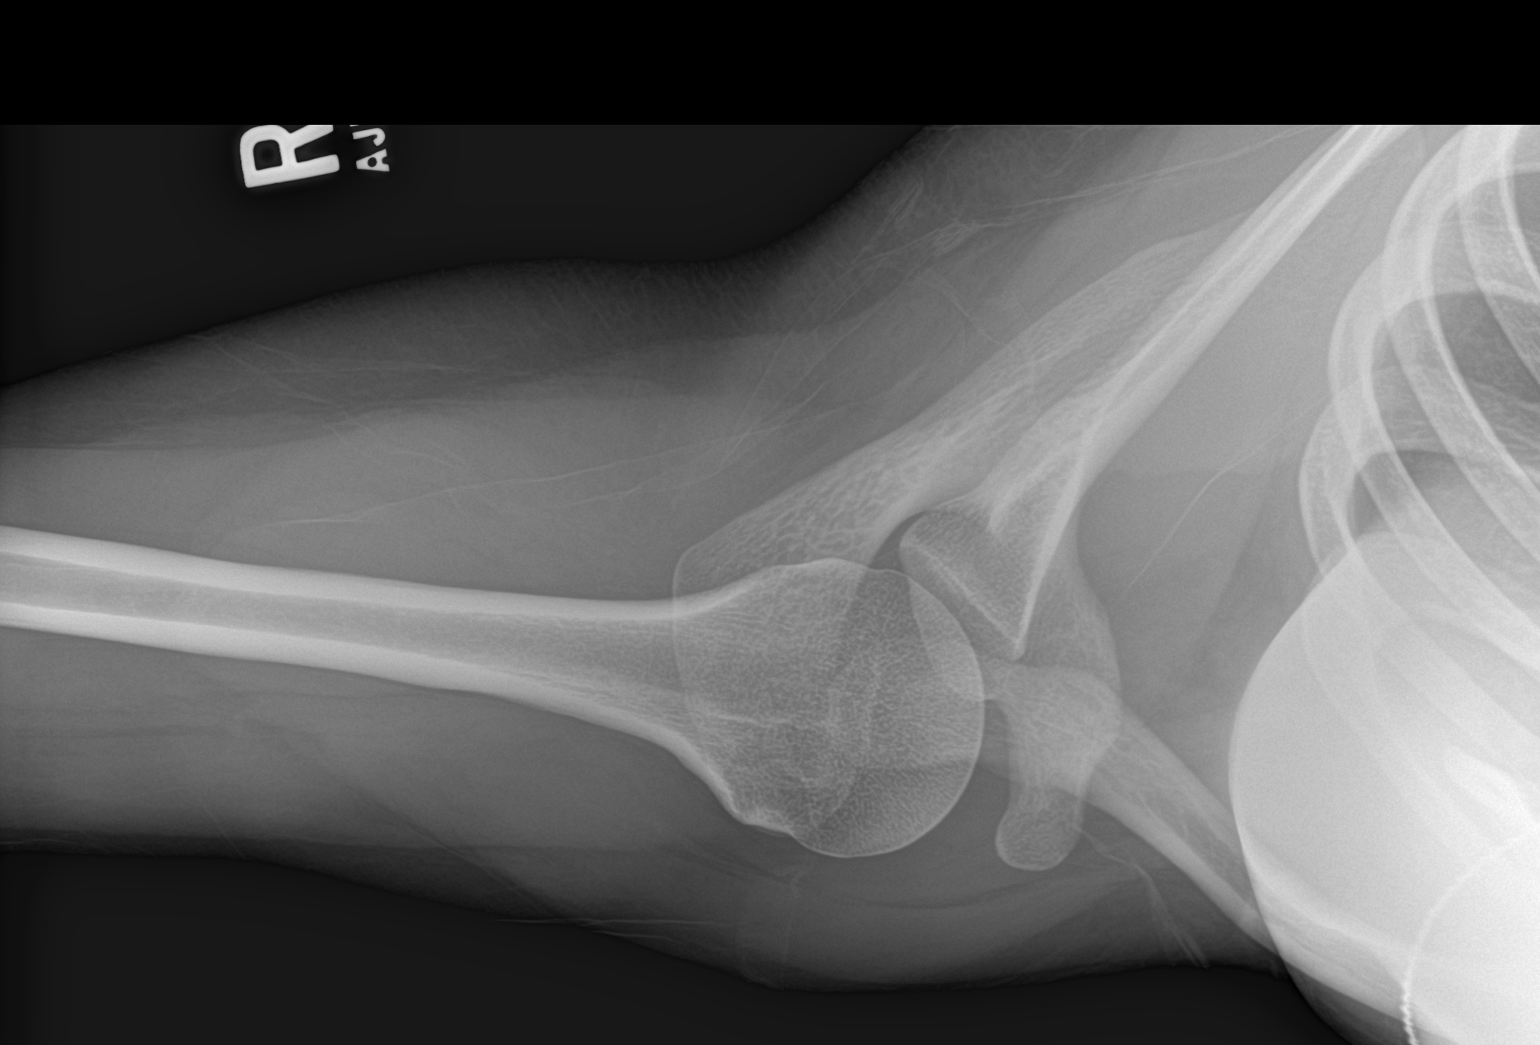

[3 of 3 positions shown; findings below may reference images not displayed]

FINDINGS: There is no evidence of fracture or dislocation. There is no
evidence of arthropathy or other focal bone abnormality. Soft
tissues are unremarkable.
IMPRESSION: Normal right shoulder.

## 2017-12-11 ENCOUNTER — Other Ambulatory Visit: Payer: Self-pay

## 2017-12-11 ENCOUNTER — Ambulatory Visit
Admission: EM | Admit: 2017-12-11 | Discharge: 2017-12-11 | Disposition: A | Discharge status: Home / Self Care | Payer: BLUE CROSS/BLUE SHIELD | Attending: Orthopedic Surgery | Admitting: Orthopedic Surgery

## 2017-12-11 DIAGNOSIS — H9201 Otalgia, right ear: Secondary | ICD-10-CM

## 2017-12-11 DIAGNOSIS — H6501 Acute serous otitis media, right ear: Secondary | ICD-10-CM

## 2017-12-11 MED ORDER — PREDNISONE 10 MG PO TABS: 10.0000 mg | ORAL_TABLET | Freq: Every day | ORAL | 0 refills | Status: AC

## 2017-12-11 MED ORDER — PSEUDOEPHEDRINE HCL 60 MG PO TABS: 60.0000 mg | ORAL_TABLET | Freq: Four times a day (QID) | ORAL | 0 refills | Status: AC | PRN

## 2017-12-11 NOTE — ED Triage Notes (Signed)
Patient c/o ringing and pressure in right ear x 2 weeks. Patient states she has noticed decrease in hearing.

## 2017-12-11 NOTE — ED Provider Notes (Signed)
[facility]    CSN: [id number] Arrival date & time: 12/11/17  1039     History   Chief Complaint Chief Complaint  Patient presents with  . Otalgia    HPI Charlotte Martin is a 32 y.o. female presents to the urgent care facility for evaluation of right ear pain.  Symptoms been present for 2 weeks.  She has had some congestion.  She states he has fullness and pressure in the right ear.  She has some decreased hearing.  She denies any trauma, bleeding, fevers.  No left ear symptoms.  HPI  Past Medical History:  Diagnosis Date  . Depression   . Frequent headaches   . Interstitial cystitis   . Syncope and collapse   . Viral cardiomyopathy (HCC) 2013  . Von Willebrand disease [facility])     Patient Active Problem List   Diagnosis Date Noted  . Viral cardiomyopathy (HCC) 07/19/2013  . Von Willebrands disease (HCC) 07/19/2013  . Dizziness 07/19/2013  . Routine general medical examination at a health care facility 07/19/2013    Past Surgical History:  Procedure Laterality Date  . CYSTOSCOPY  2008  . interstem implant    . LAPAROSCOPY  2008    OB History    No data available       Home Medications    Prior to Admission medications   Medication Sig Start Date End Date Taking? Authorizing Provider  acetaminophen (TYLENOL) 500 MG tablet Take 500 mg by mouth every 6 (six) hours as needed.   Yes [provider]  ondansetron (ZOFRAN) 8 MG tablet Take by mouth every 8 (eight) hours as needed for nausea or vomiting.   Yes [provider]  ergocalciferol (DRISDOL) 50000 UNITS capsule Take 1 capsule (50,000 Units total) by mouth once a week. Take once weekly for 12 weeks 07/20/13   Novella Olive, NP  HYDROcodone-acetaminophen (NORCO) 10-325 MG tablet Take 1 tablet by mouth every 6 (six) hours as needed.    [provider]  imipramine (TOFRANIL) 25 MG tablet Take 25 mg by mouth at bedtime.    [provider]  predniSONE  (DELTASONE) 10 MG tablet Take 1 tablet (10 mg total) by mouth daily. 6,5,4,3,2,1 six day taper 12/11/17   Evon Slack, PA-C  pseudoephedrine (SUDAFED) 60 MG tablet Take 1 tablet (60 mg total) by mouth every 6 (six) hours as needed for congestion. 12/11/17   Evon Slack, PA-C    Family History Family History  Problem Relation Age of Onset  . Arthritis Mother   . Hyperlipidemia Mother   . Hypertension Mother   . Depression Mother   . Diabetes Mother   . Seizures Sister   . Stroke Sister   . Cancer Maternal Aunt        Breast cancer  . Cancer Paternal Aunt        ovarian cancer  . Cancer Maternal Grandmother        breast cancer  . Diabetes Maternal Grandmother   . Cancer Maternal Grandfather        colon cancer  . Diabetes Maternal Grandfather   . Cancer Paternal Grandmother        ovarian cancer  . Diabetes Paternal Grandmother   . Cancer Paternal Grandfather        prostate cancer  . Diabetes Paternal Grandfather     Social History Social History   Tobacco Use  . Smoking status: Never Smoker  . Smokeless tobacco: Never  Used  Substance Use Topics  . Alcohol use: No  . Drug use: No     Allergies   Aspirin   Review of Systems Review of Systems  Constitutional: Negative for fever.  HENT: Positive for ear pain and hearing loss. Negative for ear discharge, facial swelling and trouble swallowing.   Respiratory: Negative for cough and shortness of breath.   Cardiovascular: Negative for chest pain.     Physical Exam Triage Vital Signs ED Triage Vitals  Enc Vitals Group     BP 12/11/17 1056 118/69     Pulse Rate 12/11/17 1056 89     Resp --      Temp 12/11/17 1056 98.6 F (37 C)     Temp Source 12/11/17 1056 Oral     SpO2 12/11/17 1056 100 %     Weight 12/11/17 1054 149 lb (67.6 kg)     Height 12/11/17 1054 5\' 3"  (1.6 m)     Head Circumference --      Peak Flow --      Pain Score 12/11/17 1054 9     Pain Loc --      Pain Edu? --      Excl. in  GC? --    No data found.  Updated Vital Signs BP 118/69 (BP Location: Left Arm)   Pulse 89   Temp 98.6 F (37 C) (Oral)   Ht 5\' 3"  (1.6 m)   Wt 149 lb (67.6 kg)   LMP 11/25/2017 (Exact Date)   SpO2 100%   BMI 26.39 kg/m   Visual Acuity Right Eye Distance:   Left Eye Distance:   Bilateral Distance:    Right Eye Near:   Left Eye Near:    Bilateral Near:     Physical Exam  Constitutional: She is oriented to person, place, and time. She appears well-developed and well-nourished.  HENT:  Head: Normocephalic and atraumatic.  Right TM slightly bulging with no erythema.  Significant amount of fluid behind the TM.  TM is intact.  Left TM appears normal.  Both canals are normal.  No facial swelling noted.  Eyes: Conjunctivae are normal.  Neck: Normal range of motion.  Cardiovascular: Normal rate.  Pulmonary/Chest: Effort normal. No respiratory distress.  Lymphadenopathy:    She has no cervical adenopathy.  Neurological: She is alert and oriented to person, place, and time.  Skin: Skin is warm. No rash noted.  Psychiatric: She has a normal mood and affect. Her behavior is normal.  Nursing note and vitals reviewed.    UC Treatments / Results  Labs (all labs ordered are listed, but only abnormal results are displayed) Labs Reviewed - No data to display  EKG  EKG Interpretation None       Radiology No results found.  Procedures Procedures (including critical care time)  Medications Ordered in UC Medications - No data to display   Initial Impression / Assessment and Plan / UC Course  I have reviewed the triage vital signs and the nursing notes.  Pertinent labs & imaging results that were available during my care of the patient were reviewed by me and considered in my medical decision making (see chart for details).     32 year old female with serous otitis media on the right side.  She is placed on decongestant along with 6-day steroid taper.  She will  increase fluids.  She is educated on signs symptoms return to clinic for.  Final Clinical Impressions(s) / UC Diagnoses  Final diagnoses:  Right ear pain  Right acute serous otitis media, recurrence not specified    ED Discharge Orders        Ordered    predniSONE (DELTASONE) 10 MG tablet  Daily     12/11/17 1127    pseudoephedrine (SUDAFED) 60 MG tablet  Every 6 hours PRN     12/11/17 1127        Evon Slack, [place] 12/11/17 1132

## 2017-12-11 NOTE — Discharge Instructions (Signed)
Please take ibuprofen and or Tylenol as needed for any ear pain.  Use decongestant and prednisone to help reduce congestion behind the tympanic membrane to help alleviate pressure.  Follow-up with primary care provider if no relief in 5-7 days.

## 2023-03-12 ENCOUNTER — Other Ambulatory Visit: Payer: Self-pay | Admitting: Family Medicine

## 2023-03-12 ENCOUNTER — Ambulatory Visit
Admission: RE | Admit: 2023-03-12 | Discharge: 2023-03-12 | Disposition: A | Discharge status: Home / Self Care | Payer: BC Managed Care – PPO | Source: Ambulatory Visit | Attending: Family Medicine | Admitting: Family Medicine

## 2023-03-12 DIAGNOSIS — R1013 Epigastric pain: Secondary | ICD-10-CM | POA: Diagnosis present

## 2023-03-12 DIAGNOSIS — R1032 Left lower quadrant pain: Secondary | ICD-10-CM | POA: Insufficient documentation

## 2023-03-12 MED ORDER — IOHEXOL 300 MG/ML  SOLN
80.0000 mL | Freq: Once | INTRAMUSCULAR | Status: AC | PRN
  Administered 2023-03-12: 80 mL via INTRAVENOUS
